# Patient Record
Sex: Female | Born: 1995 | Race: White | Hispanic: No | Marital: Married | Smoking: Never smoker
Health system: Southern US, Community
[De-identification: ages and names within clinical notes are randomized; demographics above are authoritative.]

## PROBLEM LIST (undated history)

## (undated) DIAGNOSIS — F329 Major depressive disorder, single episode, unspecified: Secondary | ICD-10-CM

## (undated) DIAGNOSIS — F32A Depression, unspecified: Secondary | ICD-10-CM

## (undated) DIAGNOSIS — O139 Gestational [pregnancy-induced] hypertension without significant proteinuria, unspecified trimester: Secondary | ICD-10-CM

## (undated) DIAGNOSIS — B019 Varicella without complication: Secondary | ICD-10-CM

## (undated) HISTORY — PX: NO PAST SURGERIES: SHX2092

## (undated) HISTORY — DX: Varicella without complication: B01.9

## (undated) HISTORY — DX: Major depressive disorder, single episode, unspecified: F32.9

## (undated) HISTORY — DX: Depression, unspecified: F32.A

## (undated) HISTORY — PX: WISDOM TOOTH EXTRACTION: SHX21

## (undated) HISTORY — DX: Gestational (pregnancy-induced) hypertension without significant proteinuria, unspecified trimester: O13.9

---
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
## 2016-01-13 ENCOUNTER — Ambulatory Visit: Payer: BC Managed Care – PPO | Admitting: Internal Medicine

## 2016-06-02 ENCOUNTER — Ambulatory Visit: Payer: BC Managed Care – PPO | Admitting: Family Medicine

## 2016-06-02 ENCOUNTER — Telehealth: Payer: Self-pay | Admitting: Surgical

## 2016-06-02 ENCOUNTER — Encounter: Payer: Self-pay | Admitting: Family Medicine

## 2016-06-02 ENCOUNTER — Telehealth: Payer: Self-pay

## 2016-06-02 ENCOUNTER — Ambulatory Visit (INDEPENDENT_AMBULATORY_CARE_PROVIDER_SITE_OTHER): Payer: BC Managed Care – PPO | Admitting: Family Medicine

## 2016-06-02 VITALS — BP 128/62 | HR 100 | Temp 98.7°F | Resp 18 | Wt 126.0 lb

## 2016-06-02 DIAGNOSIS — R1011 Right upper quadrant pain: Secondary | ICD-10-CM

## 2016-06-02 LAB — CBC WITH DIFFERENTIAL/PLATELET
BASOS ABS: 0 10*3/uL (ref 0.0–0.1)
Basophils Relative: 0.2 % (ref 0.0–3.0)
EOS ABS: 0.1 10*3/uL (ref 0.0–0.7)
Eosinophils Relative: 0.5 % (ref 0.0–5.0)
HCT: 40.4 % (ref 36.0–49.0)
Hemoglobin: 13.3 g/dL (ref 12.0–16.0)
LYMPHS ABS: 1.8 10*3/uL (ref 0.7–4.0)
LYMPHS PCT: 12.1 % — AB (ref 24.0–48.0)
MCHC: 33 g/dL (ref 31.0–37.0)
MCV: 86.9 fl (ref 78.0–98.0)
Monocytes Absolute: 0.8 10*3/uL (ref 0.1–1.0)
Monocytes Relative: 5.2 % (ref 3.0–12.0)
NEUTROS ABS: 12 10*3/uL — AB (ref 1.4–7.7)
NEUTROS PCT: 82 % — AB (ref 43.0–71.0)
PLATELETS: 258 10*3/uL (ref 150.0–575.0)
RBC: 4.65 Mil/uL (ref 3.80–5.70)
RDW: 12.7 % (ref 11.4–15.5)
WBC: 14.6 10*3/uL — ABNORMAL HIGH (ref 4.5–13.5)

## 2016-06-02 NOTE — Telephone Encounter (Signed)
Spoke with patient about Rt side abdominal pain. Pain started yesterday about 2 PM. She is not having fever, diarrhea, vomiting, or nausea. Pain scale of about a 3. She is having a little constipation.

## 2016-06-02 NOTE — Telephone Encounter (Signed)
Call pt:  I reviewed Joanna Walsh note. Her exam was not concerning for gallbladder issue or appendicitis, but her elevated white count is a little concerning. How is she feeling today?

## 2016-06-02 NOTE — Telephone Encounter (Signed)
pts mom(DPR signed) left v/m and [facility] and [facility]; pts mom said pt was complaining of rt side pain; pt has high pain tolerance.some of lab results were elevated and pts mom is concerned about infection and request R Baity NP to review labs and call pts mom; pts mom concerned about appendicitis or gall bladder problem.

## 2016-06-02 NOTE — Progress Notes (Signed)
Pre visit review using our clinic review tool, if applicable. No additional management support is needed unless otherwise documented below in the visit note. 

## 2016-06-02 NOTE — Patient Instructions (Signed)
Thank you for coming in today.  I do not think you have appendicitis, but am checking your blood count and will notify you of the results.  Try some acetaminophen (Tylenol) for pain and can apply heat. Please let me know if your pain worsens or if you develop a fever, diarrhea or constipation.

## 2016-06-02 NOTE — Progress Notes (Signed)
Subjective:    Patient ID: Joanna Walsh, female    DOB: 1996/04/01, 21 y.o.   MRN: [id number]  HPI This is a 21 yo female who presents today with right sided pain that started yesterday. She noticed it yesterday afternoon with stretching and sneezing. Pain is only brought on with movement. Pain is like a sore muscle, but a little worse. Slept well last night. No known trauma or new activities. No dysuria, no hematuria, no frequency, no nocturia. No nausea or vomiting. No fever- she checked. Having several bowel movements a day, small amount. No bloating or constipation. No pain with walking. Some pain up into right anterior ribs, but none into back, shoulder or across abdomen. Good fluid intake. Has not taken anything for symptoms. Mother is concerned because she rarely complains of pain. Would like CBC done.  Not sexually active, LMP 3 weeks ago. 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   Past Medical History:  Diagnosis Date  . Chicken pox   . Depression    No past surgical history on file. Family History  Problem Relation Age of Onset  . Prostate cancer Maternal Grandfather   . Heart disease Maternal Grandfather    Social History  Substance Use Topics  . Smoking status: Never Smoker  . Smokeless tobacco: Never Used  . Alcohol use No      Review of Systems Per HPI    Objective:   Physical Exam  Constitutional: She is oriented to person, place, and time. She appears well-developed and well-nourished. No distress.  HENT:  Head: Normocephalic and atraumatic.  Eyes: Conjunctivae are normal.  Neck: Normal range of motion. Neck supple.  Cardiovascular: Normal rate, regular rhythm and normal heart sounds.   Pulmonary/Chest: Effort normal and breath sounds normal.  Abdominal: Soft. Bowel sounds are normal. She exhibits no distension. There is tenderness in the right upper quadrant. There is no rigidity, no rebound, no guarding, no CVA tenderness, no  tenderness at McBurney's point and negative Murphy's sign.    Negative psoas.  Musculoskeletal: Normal range of motion.  Neurological: She is alert and oriented to person, place, and time.  Skin: Skin is warm and dry. She is not diaphoretic.  Psychiatric: She has a normal mood and affect. Her behavior is normal. Judgment and thought content normal.  Vitals reviewed.     BP 128/62 (BP Location: Left Arm, Patient Position: Sitting, Cuff Size: Normal)   Pulse 100   Temp 98.7 F (37.1 C) (Oral)   Resp 18   Wt 126 lb (57.2 kg)   LMP 05/10/2016   SpO2 97%   BMI 25.45 kg/m  Wt Readings from Last 3 Encounters:  06/02/16 126 lb (57.2 kg) (49 %, Z= -0.03)*  12/01/15 129 lb (58.5 kg) (57 %, Z= 0.17)*  08/14/15 123 lb 12.8 oz (56.2 kg) (48 %, Z= -0.04)*   * Growth percentiles are based on CDC 2-20 Years data.       Assessment & Plan:  1. Right upper quadrant abdominal pain - No worrisome findings on exam, no fever, nausea, or diarrhea, possible musculoskeletal etiology with tenderness over lower anterior ribs, pain only with movement. - CBC with Differential/Platelet- will notify her of findings and monitor symptoms - Patient Instructions  Thank you for coming in today.  I do not think you have appendicitis, but am checking your blood count and will notify you of the results.  Try some acetaminophen (Tylenol) for pain and can  apply heat. Please let me know if your pain worsens or if you develop a fever, diarrhea or constipation.   Olean Reeeborah Lucille Crichlow, FNP-BC  [facility], [facility]  06/02/2016 4:39 PM

## 2016-06-03 ENCOUNTER — Telehealth: Payer: Self-pay | Admitting: *Deleted

## 2016-06-03 NOTE — Telephone Encounter (Signed)
Spoke to pt, asked her how she was feeling today? Pt said she had pain and nausea around 7:30 this morning but now it is gone, she is feeling better. Told pt okay please call if any problems. Pt verbalized understanding.  Dr. Earlene PlaterWallace notified pt's status.

## 2016-06-03 NOTE — Telephone Encounter (Signed)
Pt's mother stated she is feeling better and already has spoken to Eunice BlaseDebbie will f/u if Sx return or worsen

## 2016-06-03 NOTE — Telephone Encounter (Signed)
noted 

## 2016-09-10 ENCOUNTER — Telehealth: Payer: Self-pay | Admitting: Internal Medicine

## 2016-09-10 NOTE — Telephone Encounter (Signed)
Left detailed msg on VM per HIPAA Copy placed in front office for pick up

## 2016-09-10 NOTE — Telephone Encounter (Signed)
Pt called and needs her immunizations for college, please call [phone] when ready for pickup.  Pt would really like to get 09/10/16, but made no promises.

## 2016-09-27 ENCOUNTER — Telehealth: Payer: Self-pay | Admitting: Internal Medicine

## 2016-09-27 NOTE — Telephone Encounter (Signed)
Patient brought in a Physical Examination form to be filled out by the provider.  The form was put in the providers Incoming prescription box.

## 2016-09-28 DIAGNOSIS — Z7689 Persons encountering health services in other specified circumstances: Secondary | ICD-10-CM

## 2016-09-29 ENCOUNTER — Ambulatory Visit (INDEPENDENT_AMBULATORY_CARE_PROVIDER_SITE_OTHER): Payer: BC Managed Care – PPO

## 2016-09-29 DIAGNOSIS — Z0289 Encounter for other administrative examinations: Secondary | ICD-10-CM | POA: Diagnosis not present

## 2016-09-29 LAB — POC URINALSYSI DIPSTICK (AUTOMATED)
BILIRUBIN UA: NEGATIVE
GLUCOSE UA: NEGATIVE
Ketones, UA: NEGATIVE
LEUKOCYTES UA: NEGATIVE
NITRITE UA: NEGATIVE
Protein, UA: NEGATIVE
RBC UA: NEGATIVE
Spec Grav, UA: >=1.03 — AB (ref 1.010–1.025)
Urobilinogen, UA: 0.2 E.U./dL
pH, UA: 6 (ref 5.0–8.0)

## 2016-10-05 NOTE — Telephone Encounter (Signed)
Form has been completed and given to pt.

## 2016-10-25 ENCOUNTER — Other Ambulatory Visit: Payer: Self-pay | Admitting: Internal Medicine

## 2016-10-25 DIAGNOSIS — F329 Major depressive disorder, single episode, unspecified: Secondary | ICD-10-CM

## 2016-10-25 DIAGNOSIS — F32A Depression, unspecified: Secondary | ICD-10-CM

## 2016-12-16 ENCOUNTER — Other Ambulatory Visit: Payer: Self-pay | Admitting: Internal Medicine

## 2016-12-16 DIAGNOSIS — F329 Major depressive disorder, single episode, unspecified: Secondary | ICD-10-CM

## 2016-12-16 DIAGNOSIS — F32A Depression, unspecified: Secondary | ICD-10-CM

## 2017-02-24 ENCOUNTER — Other Ambulatory Visit: Payer: Self-pay | Admitting: Internal Medicine

## 2017-02-24 DIAGNOSIS — F32A Depression, unspecified: Secondary | ICD-10-CM

## 2017-02-24 DIAGNOSIS — F329 Major depressive disorder, single episode, unspecified: Secondary | ICD-10-CM

## 2017-02-25 NOTE — Telephone Encounter (Signed)
Overdue CPE letter mailed to pt 

## 2017-04-02 ENCOUNTER — Other Ambulatory Visit: Payer: Self-pay | Admitting: Internal Medicine

## 2017-04-02 DIAGNOSIS — F32A Depression, unspecified: Secondary | ICD-10-CM

## 2017-04-02 DIAGNOSIS — F329 Major depressive disorder, single episode, unspecified: Secondary | ICD-10-CM

## 2017-04-27 ENCOUNTER — Telehealth: Payer: Self-pay | Admitting: Internal Medicine

## 2017-04-27 DIAGNOSIS — F32A Depression, unspecified: Secondary | ICD-10-CM

## 2017-04-27 DIAGNOSIS — F329 Major depressive disorder, single episode, unspecified: Secondary | ICD-10-CM

## 2017-04-27 NOTE — Telephone Encounter (Signed)
[facility] has been trying to fax request to office for a refill of Zoloft but states request are not going through.

## 2017-04-27 NOTE — Telephone Encounter (Signed)
Copied from CRM [phone]. Topic: Quick Communication - Rx Refill/Question >> Apr 27, 2017  4:10 PM Viviann SpareWhite, [place] wrote: Has the patient contacted their pharmacy? Yes.    sertraline (ZOLOFT) 50 MG tablet (PHARMACIST BEEN TRYING TO FAX REQUEST OVER, NOT GOING THRU)  (Agent: If no, request that the patient contact the pharmacy for the refill.)  Preferred Pharmacy (with phone number or street name):  [id number] [place] [place] - [address] AT [address]OSS [address] [place] [id number] Phone: [phone] Fax: [phone]   Agent: Please be advised that RX refills may take up to 3 business days. We ask that you follow-up with your pharmacy.

## 2017-04-28 MED ORDER — SERTRALINE HCL 50 MG PO TABS: 50.0000 mg | ORAL_TABLET | Freq: Every day | ORAL | 0 refills | Status: DC

## 2017-04-28 NOTE — Telephone Encounter (Signed)
I have sent in 1 refill, but pt must schedule CPE for any more refills... Next refill will be denied if CPE has not been scheduled

## 2017-05-01 ENCOUNTER — Other Ambulatory Visit: Payer: Self-pay | Admitting: Internal Medicine

## 2017-05-01 DIAGNOSIS — F32A Depression, unspecified: Secondary | ICD-10-CM

## 2017-05-01 DIAGNOSIS — F329 Major depressive disorder, single episode, unspecified: Secondary | ICD-10-CM

## 2017-05-02 NOTE — Telephone Encounter (Signed)
Letter mailed to pt as well.

## 2018-04-24 ENCOUNTER — Encounter: Payer: Self-pay | Admitting: Internal Medicine

## 2018-04-24 ENCOUNTER — Ambulatory Visit (INDEPENDENT_AMBULATORY_CARE_PROVIDER_SITE_OTHER): Payer: Managed Care, Other (non HMO) | Admitting: Internal Medicine

## 2018-04-24 VITALS — BP 110/66 | HR 98 | Temp 99.0°F | Wt 128.0 lb

## 2018-04-24 DIAGNOSIS — R509 Fever, unspecified: Secondary | ICD-10-CM | POA: Diagnosis not present

## 2018-04-24 DIAGNOSIS — R05 Cough: Secondary | ICD-10-CM | POA: Diagnosis not present

## 2018-04-24 DIAGNOSIS — J029 Acute pharyngitis, unspecified: Secondary | ICD-10-CM

## 2018-04-24 DIAGNOSIS — R059 Cough, unspecified: Secondary | ICD-10-CM

## 2018-04-24 LAB — POC INFLUENZA A&B (BINAX/QUICKVUE)
Influenza A, POC: NEGATIVE
Influenza B, POC: NEGATIVE

## 2018-04-24 LAB — POCT RAPID STREP A (OFFICE): Rapid Strep A Screen: NEGATIVE

## 2018-04-24 NOTE — Addendum Note (Signed)
Addended by: Roena MaladyEVONTENNO, Akeia Perot Y on: 04/24/2018 04:24 PM   Modules accepted: Orders

## 2018-04-24 NOTE — Patient Instructions (Signed)
Sore Throat  When you have a sore throat, your throat may feel:  · Tender.  · Burning.  · Irritated.  · Scratchy.  · Painful when you swallow.  · Painful when you talk.  Many things can cause a sore throat, such as:  · An infection.  · Allergies.  · Dry air.  · Smoke or pollution.  · Radiation treatment.  · Gastroesophageal reflux disease (GERD).  · A tumor.  A sore throat can be the first sign of another sickness. It can happen with other problems, like:  · Coughing.  · Sneezing.  · Fever.  · Swelling in the neck.  Most sore throats go away without treatment.  Follow these instructions at home:         · Take over-the-counter medicines only as told by your doctor.  ? If your child has a sore throat, do not give your child aspirin.  · Drink enough fluids to keep your pee (urine) pale yellow.  · Rest when you feel you need to.  · To help with pain:  ? Sip warm liquids, such as broth, herbal tea, or warm water.  ? Eat or drink cold or frozen liquids, such as frozen ice pops.  ? Gargle with a salt-water mixture 3-4 times a day or as needed. To make a salt-water mixture, add ½-1 tsp (3-6 g) of salt to 1 cup (237 mL) of warm water. Mix it until you cannot see the salt anymore.  ? Suck on hard candy or throat lozenges.  ? Put a cool-mist humidifier in your bedroom at night.  ? Sit in the bathroom with the door closed for 5-10 minutes while you run hot water in the shower.  · Do not use any products that contain nicotine or tobacco, such as cigarettes, e-cigarettes, and chewing tobacco. If you need help quitting, ask your doctor.  · Wash your hands well and often with soap and water. If soap and water are not available, use hand sanitizer.  Contact a doctor if:  · You have a fever for more than 2-3 days.  · You keep having symptoms for more than 2-3 days.  · Your throat does not get better in 7 days.  · You have a fever and your symptoms suddenly get worse.  · Your child who is 3 months to 3 years old has a temperature of  102.2°F (39°C) or higher.  Get help right away if:  · You have trouble breathing.  · You cannot swallow fluids, soft foods, or your saliva.  · You have swelling in your throat or neck that gets worse.  · You keep feeling sick to your stomach (nauseous).  · You keep throwing up (vomiting).  Summary  · A sore throat is pain, burning, irritation, or scratchiness in the throat. Many things can cause a sore throat.  · Take over-the-counter medicines only as told by your doctor. Do not give your child aspirin.  · Drink plenty of fluids, and rest as needed.  · Contact a doctor if your symptoms get worse or your sore throat does not get better within 7 days.  This information is not intended to replace advice given to you by your health care provider. Make sure you discuss any questions you have with your health care provider.  Document Released: 01/20/2008 Document Revised: 09/12/2017 Document Reviewed: 09/12/2017  Elsevier Interactive Patient Education © 2019 [employer]

## 2018-04-24 NOTE — Progress Notes (Signed)
Subjective:    Patient ID: Joanna FisherKaitlyn Furber, female    DOB: [birth year], 22 y.o.   MRN: [id number]  HPI  Pt presents to the clinic today with c/o sore throat and cough. She reports this started yesterday. She is having some difficulty swallowing. The cough is non productive. She denies runny nose, nasal congestion, ear pain, or shortness of breath. She reports fever up to 102.0. She has tired Dayquil with some relief. She has had sick contacts diagnosed with strep.   Review of Systems      Past Medical History:  Diagnosis Date  . Chicken pox   . Depression     Current Outpatient Medications  Medication Sig Dispense Refill  . sertraline (ZOLOFT) 50 MG tablet Take 1 tablet (50 mg total) by mouth daily. LAST REFILL SCHEDULE ANNUAL EXAM (Patient not taking: Reported on 04/24/2018) 30 tablet 0   No current facility-administered medications for this visit.     No Known Allergies  Family History  Problem Relation Age of Onset  . Prostate cancer Maternal Grandfather   . Heart disease Maternal Grandfather     Social History   Socioeconomic History  . Marital status: Single    Spouse name: Not on file  . Number of children: Not on file  . Years of education: Not on file  . Highest education level: Not on file  Occupational History  . Not on file  Social Needs  . Financial resource strain: Not on file  . Food insecurity:    Worry: Not on file    Inability: Not on file  . Transportation needs:    Medical: Not on file    Non-medical: Not on file  Tobacco Use  . Smoking status: Never Smoker  . Smokeless tobacco: Never Used  Substance and Sexual Activity  . Alcohol use: No    Alcohol/week: 0.0 standard drinks  . Drug use: No  . Sexual activity: Never  Lifestyle  . Physical activity:    Days per week: Not on file    Minutes per session: Not on file  . Stress: Not on file  Relationships  . Social connections:    Talks on phone: Not on file    Gets together: Not on  file    Attends religious service: Not on file    Active member of club or organization: Not on file    Attends meetings of clubs or organizations: Not on file    Relationship status: Not on file  . Intimate partner violence:    Fear of current or ex partner: Not on file    Emotionally abused: Not on file    Physically abused: Not on file    Forced sexual activity: Not on file  Other Topics Concern  . Not on file  Social History Narrative  . Not on file     Constitutional: Pt reports fever. Denies malaise, fatigue, headache or abrupt weight changes.  HEENT: Pt reports sore throat. Denies eye pain, eye redness, ear pain, ringing in the ears, wax buildup, runny nose, nasal congestion, bloody nose. Respiratory: Pt reports cough. Denies difficulty breathing, shortness of breath, or sputum production.   Cardiovascular: Denies chest pain, chest tightness, palpitations or swelling in the hands or feet.   No other specific complaints in a complete review of systems (except as listed in HPI above).  Objective:   Physical Exam  BP 110/66   Pulse 98   Temp 99 F (37.2 C) (Oral)   Wt  128 lb (58.1 kg)   LMP 04/10/2018   SpO2 98%   BMI 25.85 kg/m  Wt Readings from Last 3 Encounters:  04/24/18 128 lb (58.1 kg)  06/02/16 126 lb (57.2 kg) (49 %, Z= -0.03)*  12/01/15 129 lb (58.5 kg) (57 %, Z= 0.17)*   * Growth percentiles are based on CDC (Girls, 2-20 Years) data.    General: Appears her stated age, well developed, well nourished in NAD. HEENT: Head: normal shape and size, no sinus tenderness noted;  Ears: Tm's gray and intact, normal light reflex; Nose: mucosa pink and moist, septum midline; Throat/Mouth: Teeth present, mucosa erythematous and moist, no exudate, lesions or ulcerations noted.  Neck: Bilateral anterior cervical adenopathy noted. Cardiovascular: Normal rate and rhythm. S1,S2 noted.  No murmur, rubs or gallops noted.  Pulmonary/Chest: Normal effort and positive vesicular  breath sounds. No respiratory distress. No wheezes, rales or ronchi noted.   BMET    Component Value Date/Time   NA 139 12/01/2015 1341   K 4.0 12/01/2015 1341   CL 105 12/01/2015 1341   CO2 27 12/01/2015 1341   GLUCOSE 72 12/01/2015 1341   BUN 14 12/01/2015 1341   CREATININE 0.91 12/01/2015 1341   CALCIUM 9.5 12/01/2015 1341    Lipid Panel     Component Value Date/Time   CHOL 110 12/01/2015 1341   TRIG 68.0 12/01/2015 1341   HDL 45.00 12/01/2015 1341   CHOLHDL 2 12/01/2015 1341   VLDL 13.6 12/01/2015 1341   LDLCALC 51 12/01/2015 1341    CBC    Component Value Date/Time   WBC 14.6 (H) 06/02/2016 1157   RBC 4.65 06/02/2016 1157   HGB 13.3 06/02/2016 1157   HCT 40.4 06/02/2016 1157   PLT 258.0 06/02/2016 1157   MCV 86.9 06/02/2016 1157   MCHC 33.0 06/02/2016 1157   RDW 12.7 06/02/2016 1157   LYMPHSABS 1.8 06/02/2016 1157   MONOABS 0.8 06/02/2016 1157   EOSABS 0.1 06/02/2016 1157   BASOSABS 0.0 06/02/2016 1157    Hgb A1C No results found for: HGBA1C          Assessment & Plan:   Sore Throat, Cough, Fever:  RST: negative Rapid flu: negative Salt water gargles may be helpful Can take Ibuprofen 600 mg every 8 hours as needed for pain and inflammation Can take Zyrtec 10 mg day to reduce any PND that could be a contributing factor  Return precautions discussed Nicki Reaperegina Tieasha Larsen, NP

## 2018-05-01 ENCOUNTER — Ambulatory Visit: Payer: Managed Care, Other (non HMO) | Admitting: Family Medicine

## 2018-12-11 ENCOUNTER — Telehealth: Payer: Self-pay | Admitting: Internal Medicine

## 2018-12-11 NOTE — Telephone Encounter (Signed)
Last OV acute 03/2018 but last CPE 2017... does pt need to schedule CPE?

## 2018-12-11 NOTE — Telephone Encounter (Signed)
Ok to give Tdap but pt needs to schedule annual exam

## 2018-12-11 NOTE — Telephone Encounter (Signed)
Best number [phone] Pt called needing to get a td booster before she can go to school.  She needs this by Wednesday  Is it ok to schedule

## 2018-12-12 NOTE — Telephone Encounter (Signed)
After 11am and before 2pm tomorrow

## 2018-12-12 NOTE — Telephone Encounter (Signed)
Can I put pt on schedule and  Comment see melanie. Either this afternoon or tomorrow I have not called pt yet

## 2018-12-12 NOTE — Telephone Encounter (Signed)
Left message asking pt to call office  °

## 2018-12-13 NOTE — Telephone Encounter (Signed)
Spoke with pt she stated [facility] went to [facility]

## 2019-10-23 ENCOUNTER — Encounter: Payer: Managed Care, Other (non HMO) | Admitting: Internal Medicine

## 2019-10-25 ENCOUNTER — Other Ambulatory Visit: Payer: Self-pay

## 2019-10-25 ENCOUNTER — Encounter: Payer: Self-pay | Admitting: Internal Medicine

## 2019-10-25 ENCOUNTER — Ambulatory Visit (INDEPENDENT_AMBULATORY_CARE_PROVIDER_SITE_OTHER): Payer: Managed Care, Other (non HMO) | Admitting: Internal Medicine

## 2019-10-25 VITALS — BP 120/80 | HR 71 | Temp 98.1°F | Ht 59.0 in | Wt 167.0 lb

## 2019-10-25 DIAGNOSIS — F330 Major depressive disorder, recurrent, mild: Secondary | ICD-10-CM

## 2019-10-25 DIAGNOSIS — Z0000 Encounter for general adult medical examination without abnormal findings: Secondary | ICD-10-CM | POA: Diagnosis not present

## 2019-10-25 LAB — COMPREHENSIVE METABOLIC PANEL
ALT: 15 U/L (ref 0–35)
AST: 19 U/L (ref 0–37)
Albumin: 4.6 g/dL (ref 3.5–5.2)
Alkaline Phosphatase: 51 U/L (ref 39–117)
BUN: 15 mg/dL (ref 6–23)
CO2: 28 mEq/L (ref 19–32)
Calcium: 9.7 mg/dL (ref 8.4–10.5)
Chloride: 104 mEq/L (ref 96–112)
Creatinine, Ser: 0.86 mg/dL (ref 0.40–1.20)
GFR: 82.14 mL/min (ref >60.00)
Glucose, Bld: 91 mg/dL (ref 70–99)
Potassium: 4.2 mEq/L (ref 3.5–5.1)
Sodium: 138 mEq/L (ref 135–145)
Total Bilirubin: 0.8 mg/dL (ref 0.2–1.2)
Total Protein: 7.2 g/dL (ref 6.0–8.3)

## 2019-10-25 LAB — CBC
HCT: 38.9 % (ref 36.0–46.0)
Hemoglobin: 13.2 g/dL (ref 12.0–15.0)
MCHC: 34 g/dL (ref 30.0–36.0)
MCV: 87.6 fl (ref 78.0–100.0)
Platelets: 253 10*3/uL (ref 150.0–400.0)
RBC: 4.43 Mil/uL (ref 3.87–5.11)
RDW: 12.1 % (ref 11.5–15.5)
WBC: 8.6 10*3/uL (ref 4.0–10.5)

## 2019-10-25 LAB — LIPID PANEL
Cholesterol: 140 mg/dL (ref 0–200)
HDL: 53.2 mg/dL (ref >39.00)
LDL Cholesterol: 74 mg/dL (ref 0–99)
NonHDL: 87.23
Total CHOL/HDL Ratio: 3
Triglycerides: 66 mg/dL (ref 0.0–149.0)
VLDL: 13.2 mg/dL (ref 0.0–40.0)

## 2019-10-25 LAB — TSH: TSH: 1.65 u[IU]/mL (ref 0.35–4.50)

## 2019-10-25 LAB — HEMOGLOBIN A1C: Hgb A1c MFr Bld: 5.3 % (ref 4.6–6.5)

## 2019-10-25 MED ORDER — BUPROPION HCL ER (XL) 150 MG PO TB24: 150.0000 mg | ORAL_TABLET | Freq: Every day | ORAL | 2 refills | Status: DC

## 2019-10-25 NOTE — Progress Notes (Signed)
Subjective:    Patient ID: Joanna Walsh, female    DOB: 08-23-1995, 24 y.o.   MRN: [id number]  HPI  Pt presents to the clinic today for her annual exam.  Depression: Persistent. She is no longer taking Sertraline die to SI. She does not see a therapist. She denies anxiety, SI/HI.  Flu: never Tetanus: 11/2018 Covid: 06/2019, 07/2019 Pap Smear: never, [facility] Dentist: biannually  Diet: She does eat meat. She consumes more veggies than fruit. She does eat some fried foods. She drinks mostly water, green tea. Exercise: None since Covid  Review of Systems      Past Medical History:  Diagnosis Date  . Chicken pox   . Depression     Current Outpatient Medications  Medication Sig Dispense Refill  . sertraline (ZOLOFT) 50 MG tablet Take 1 tablet (50 mg total) by mouth daily. LAST REFILL SCHEDULE ANNUAL EXAM (Patient not taking: Reported on 04/24/2018) 30 tablet 0   No current facility-administered medications for this visit.    No Known Allergies  Family History  Problem Relation Age of Onset  . Prostate cancer Maternal Grandfather   . Heart disease Maternal Grandfather     Social History   Socioeconomic History  . Marital status: Single    Spouse name: Not on file  . Number of children: Not on file  . Years of education: Not on file  . Highest education level: Not on file  Occupational History  . Not on file  Tobacco Use  . Smoking status: Never Smoker  . Smokeless tobacco: Never Used  Substance and Sexual Activity  . Alcohol use: No    Alcohol/week: 0.0 standard drinks  . Drug use: No  . Sexual activity: Never  Other Topics Concern  . Not on file  Social History Narrative  . Not on file   Social Determinants of Health   Financial Resource Strain:   . Difficulty of Paying Living Expenses:   Food Insecurity:   . Worried About Programme researcher, broadcasting/film/video in the Last Year:   . Barista in the Last Year:   Transportation Needs:   . Automotive engineer (Medical):   Marland Kitchen Lack of Transportation (Non-Medical):   Physical Activity:   . Days of Exercise per Week:   . Minutes of Exercise per Session:   Stress:   . Feeling of Stress :   Social Connections:   . Frequency of Communication with Friends and Family:   . Frequency of Social Gatherings with Friends and Family:   . Attends Religious Services:   . Active Member of Clubs or Organizations:   . Attends Banker Meetings:   Marland Kitchen Marital Status:   Intimate Partner Violence:   . Fear of Current or Ex-Partner:   . Emotionally Abused:   Marland Kitchen Physically Abused:   . Sexually Abused:      Constitutional: Denies fever, malaise, fatigue, headache or abrupt weight changes.  HEENT: Denies eye pain, eye redness, ear pain, ringing in the ears, wax buildup, runny nose, nasal congestion, bloody nose, or sore throat. Respiratory: Denies difficulty breathing, shortness of breath, cough or sputum production.   Cardiovascular: Denies chest pain, chest tightness, palpitations or swelling in the hands or feet.  Gastrointestinal: Denies abdominal pain, bloating, constipation, diarrhea or blood in the stool.  GU: Denies urgency, frequency, pain with urination, burning sensation, blood in urine, odor or discharge. Musculoskeletal: Denies decrease in range of motion, difficulty with gait, muscle  pain or joint pain and swelling.  Skin: Denies redness, rashes, lesions or ulcercations.  Neurological: Denies dizziness, difficulty with memory, difficulty with speech or problems with balance and coordination.  Psych: Pt reports depression. Denies anxiety, SI/HI.  No other specific complaints in a complete review of systems (except as listed in HPI above).  Objective:   Physical Exam  BP 120/80   Pulse 71   Temp 98.1 F (36.7 C) (Temporal)   Ht 4\' 11"  (1.499 m)   Wt 167 lb (75.8 kg)   LMP 10/18/2019 Comment: irregular  SpO2 98%   BMI 33.73 kg/m   Wt Readings from Last 3 Encounters:    04/24/18 128 lb (58.1 kg)  06/02/16 126 lb (57.2 kg) (49 %, Z= -0.03)*  12/01/15 129 lb (58.5 kg) (57 %, Z= 0.17)*   * Growth percentiles are based on CDC (Girls, 2-20 Years) data.    General: Appears her stated age, obese, in NAD. Skin: Warm, dry and intact. No rashesnoted. HEENT: Head: normal shape and size; Eyes: sclera white, no icterus, conjunctiva pink, PERRLA and EOMs intact; Neck:  Neck supple, trachea midline. No masses, lumps or thyromegaly present.  Cardiovascular: Normal rate and rhythm. S1,S2 noted.  No murmur, rubs or gallops noted. Noor BLE edema.  Pulmonary/Chest: Normal effort and positive vesicular breath sounds. No respiratory distress. No wheezes, rales or ronchi noted.  Abdomen: Soft and nontender. Normal bowel sounds. No distention or masses noted. Liver, spleen and kidneys non palpable. Musculoskeletal: Normal range of motion. Strength 5/5 BUE/BLE. No difficulty with gait.  Neurological: Alert and oriented. Cranial nerves II-XII grossly intact. Coordination normal.  Psychiatric: Mood and affect normal. Behavior is normal. Judgment and thought content normal.    BMET    Component Value Date/Time   NA 139 12/01/2015 1341   K 4.0 12/01/2015 1341   CL 105 12/01/2015 1341   CO2 27 12/01/2015 1341   GLUCOSE 72 12/01/2015 1341   BUN 14 12/01/2015 1341   CREATININE 0.91 12/01/2015 1341   CALCIUM 9.5 12/01/2015 1341    Lipid Panel     Component Value Date/Time   CHOL 110 12/01/2015 1341   TRIG 68.0 12/01/2015 1341   HDL 45.00 12/01/2015 1341   CHOLHDL 2 12/01/2015 1341   VLDL 13.6 12/01/2015 1341   LDLCALC 51 12/01/2015 1341    CBC    Component Value Date/Time   WBC 14.6 (H) 06/02/2016 1157   RBC 4.65 06/02/2016 1157   HGB 13.3 06/02/2016 1157   HCT 40.4 06/02/2016 1157   PLT 258.0 06/02/2016 1157   MCV 86.9 06/02/2016 1157   MCHC 33.0 06/02/2016 1157   RDW 12.7 06/02/2016 1157   LYMPHSABS 1.8 06/02/2016 1157   MONOABS 0.8 06/02/2016 1157    EOSABS 0.1 06/02/2016 1157   BASOSABS 0.0 06/02/2016 1157    Hgb A1C No results found for: HGBA1C         Assessment & Plan:   Preventative Health Maintenance:  Encouraged her to get a flu shot in the fall Tetanus UTD She will schedule her pap smear with [facility] Encouraged her to consume a balanced diet and exercise regimen Advised her to see an eye doctor and dentist annually Will check CBC, CMET, TSH, Lipid profile and A1C today  RTC in 1 year, sooner if needed 07/31/2016, NP This visit occurred during the SARS-CoV-2 public health emergency.  Safety protocols were in place, including screening questions prior to the visit, additional usage of staff PPE,  and extensive cleaning of exam room while observing appropriate contact time as indicated for disinfecting solutions.

## 2019-10-25 NOTE — Assessment & Plan Note (Signed)
Deteriorated Support offered today Will trial Wellbutrin She will consider therapy and call to see if her insurance cover

## 2019-10-25 NOTE — Patient Instructions (Signed)
Health Maintenance, Female Adopting a healthy lifestyle and getting preventive care are important in promoting health and wellness. Ask your health care provider about:  The right schedule for you to have regular tests and exams.  Things you can do on your own to prevent diseases and keep yourself healthy. What should I know about diet, weight, and exercise? Eat a healthy diet   Eat a diet that includes plenty of vegetables, fruits, low-fat dairy products, and lean protein.  Do not eat a lot of foods that are high in solid fats, added sugars, or sodium. Maintain a healthy weight Body mass index (BMI) is used to identify weight problems. It estimates body fat based on height and weight. Your health care provider can help determine your BMI and help you achieve or maintain a healthy weight. Get regular exercise Get regular exercise. This is one of the most important things you can do for your health. Most adults should:  Exercise for at least 150 minutes each week. The exercise should increase your heart rate and make you sweat (moderate-intensity exercise).  Do strengthening exercises at least twice a week. This is in addition to the moderate-intensity exercise.  Spend less time sitting. Even light physical activity can be beneficial. Watch cholesterol and blood lipids Have your blood tested for lipids and cholesterol at 23 years of age, then have this test every 5 years. Have your cholesterol levels checked more often if:  Your lipid or cholesterol levels are high.  You are older than 23 years of age.  You are at high risk for heart disease. What should I know about cancer screening? Depending on your health history and family history, you may need to have cancer screening at various ages. This may include screening for:  Breast cancer.  Cervical cancer.  Colorectal cancer.  Skin cancer.  Lung cancer. What should I know about heart disease, diabetes, and high blood  pressure? Blood pressure and heart disease  High blood pressure causes heart disease and increases the risk of stroke. This is more likely to develop in people who have high blood pressure readings, are of African descent, or are overweight.  Have your blood pressure checked: ? Every 3-5 years if you are 18-39 years of age. ? Every year if you are 40 years old or older. Diabetes Have regular diabetes screenings. This checks your fasting blood sugar level. Have the screening done:  Once every three years after age 40 if you are at a normal weight and have a low risk for diabetes.  More often and at a younger age if you are overweight or have a high risk for diabetes. What should I know about preventing infection? Hepatitis B If you have a higher risk for hepatitis B, you should be screened for this virus. Talk with your health care provider to find out if you are at risk for hepatitis B infection. Hepatitis C Testing is recommended for:  Everyone born from 1945 through 1965.  Anyone with known risk factors for hepatitis C. Sexually transmitted infections (STIs)  Get screened for STIs, including gonorrhea and chlamydia, if: ? You are sexually active and are younger than 24 years of age. ? You are older than 24 years of age and your health care provider tells you that you are at risk for this type of infection. ? Your sexual activity has changed since you were last screened, and you are at increased risk for chlamydia or gonorrhea. Ask your health care provider if   you are at risk.  Ask your health care provider about whether you are at high risk for HIV. Your health care provider may recommend a prescription medicine to help prevent HIV infection. If you choose to take medicine to prevent HIV, you should first get tested for HIV. You should then be tested every 3 months for as long as you are taking the medicine. Pregnancy  If you are about to stop having your period (premenopausal) and  you may become pregnant, seek counseling before you get pregnant.  Take 400 to 800 micrograms (mcg) of folic acid every day if you become pregnant.  Ask for birth control (contraception) if you want to prevent pregnancy. Osteoporosis and menopause Osteoporosis is a disease in which the bones lose minerals and strength with aging. This can result in bone fractures. If you are 65 years old or older, or if you are at risk for osteoporosis and fractures, ask your health care provider if you should:  Be screened for bone loss.  Take a calcium or vitamin D supplement to lower your risk of fractures.  Be given hormone replacement therapy (HRT) to treat symptoms of menopause. Follow these instructions at home: Lifestyle  Do not use any products that contain nicotine or tobacco, such as cigarettes, e-cigarettes, and chewing tobacco. If you need help quitting, ask your health care provider.  Do not use street drugs.  Do not share needles.  Ask your health care provider for help if you need support or information about quitting drugs. Alcohol use  Do not drink alcohol if: ? Your health care provider tells you not to drink. ? You are pregnant, may be pregnant, or are planning to become pregnant.  If you drink alcohol: ? Limit how much you use to 0-1 drink a day. ? Limit intake if you are breastfeeding.  Be aware of how much alcohol is in your drink. In the [place] one drink equals one 12 oz bottle of beer (355 mL), one 5 oz glass of wine (148 mL), or one 1 oz glass of hard liquor (44 mL). General instructions  Schedule regular health, dental, and eye exams.  Stay current with your vaccines.  Tell your health care provider if: ? You often feel depressed. ? You have ever been abused or do not feel safe at home. Summary  Adopting a healthy lifestyle and getting preventive care are important in promoting health and wellness.  Follow your health care provider's instructions about healthy  diet, exercising, and getting tested or screened for diseases.  Follow your health care provider's instructions on monitoring your cholesterol and blood pressure. This information is not intended to replace advice given to you by your health care provider. Make sure you discuss any questions you have with your health care provider. Document Revised: 04/05/2018 Document Reviewed: 04/05/2018 Elsevier Patient Education  2020 [employer]  

## 2019-11-02 ENCOUNTER — Encounter: Payer: Self-pay | Admitting: Internal Medicine

## 2019-11-16 ENCOUNTER — Other Ambulatory Visit: Payer: Self-pay | Admitting: Internal Medicine

## 2019-11-16 DIAGNOSIS — Z0000 Encounter for general adult medical examination without abnormal findings: Secondary | ICD-10-CM

## 2020-02-12 ENCOUNTER — Other Ambulatory Visit: Payer: Self-pay | Admitting: Internal Medicine

## 2020-02-12 DIAGNOSIS — Z0000 Encounter for general adult medical examination without abnormal findings: Secondary | ICD-10-CM

## 2020-02-14 LAB — HM PAP SMEAR: HM Pap smear: NORMAL

## 2020-02-26 LAB — OB RESULTS CONSOLE ANTIBODY SCREEN: Antibody Screen: NEGATIVE

## 2020-02-26 LAB — OB RESULTS CONSOLE RPR: RPR: NONREACTIVE

## 2020-02-26 LAB — OB RESULTS CONSOLE ABO/RH: RH Type: POSITIVE

## 2020-02-26 LAB — OB RESULTS CONSOLE GC/CHLAMYDIA
Chlamydia: NEGATIVE
Gonorrhea: NEGATIVE

## 2020-02-26 LAB — OB RESULTS CONSOLE HIV ANTIBODY (ROUTINE TESTING): HIV: NONREACTIVE

## 2020-02-26 LAB — OB RESULTS CONSOLE RUBELLA ANTIBODY, IGM: Rubella: IMMUNE

## 2020-02-26 LAB — OB RESULTS CONSOLE HEPATITIS B SURFACE ANTIGEN: Hepatitis B Surface Ag: NEGATIVE

## 2020-05-27 DIAGNOSIS — Z419 Encounter for procedure for purposes other than remedying health state, unspecified: Secondary | ICD-10-CM | POA: Diagnosis not present

## 2020-06-24 DIAGNOSIS — Z419 Encounter for procedure for purposes other than remedying health state, unspecified: Secondary | ICD-10-CM | POA: Diagnosis not present

## 2020-07-25 DIAGNOSIS — Z419 Encounter for procedure for purposes other than remedying health state, unspecified: Secondary | ICD-10-CM | POA: Diagnosis not present

## 2020-08-24 DIAGNOSIS — Z419 Encounter for procedure for purposes other than remedying health state, unspecified: Secondary | ICD-10-CM | POA: Diagnosis not present

## 2020-09-02 ENCOUNTER — Telehealth (HOSPITAL_COMMUNITY): Payer: Self-pay | Admitting: *Deleted

## 2020-09-02 ENCOUNTER — Encounter (HOSPITAL_COMMUNITY): Payer: Self-pay | Admitting: *Deleted

## 2020-09-02 NOTE — Telephone Encounter (Signed)
Preadmission screen  

## 2020-09-03 ENCOUNTER — Encounter (HOSPITAL_COMMUNITY): Payer: Self-pay | Admitting: *Deleted

## 2020-09-09 ENCOUNTER — Telehealth (HOSPITAL_COMMUNITY): Payer: Self-pay | Admitting: *Deleted

## 2020-09-09 NOTE — Telephone Encounter (Signed)
Preadmission screen  

## 2020-09-12 LAB — OB RESULTS CONSOLE GBS: GBS: NEGATIVE

## 2020-09-15 ENCOUNTER — Other Ambulatory Visit (HOSPITAL_COMMUNITY): Payer: Managed Care, Other (non HMO)

## 2020-09-17 ENCOUNTER — Inpatient Hospital Stay (HOSPITAL_COMMUNITY)
Admission: AD | Admit: 2020-09-17 | Payer: Managed Care, Other (non HMO) | Source: Home / Self Care | Admitting: Obstetrics and Gynecology

## 2020-09-17 ENCOUNTER — Inpatient Hospital Stay (HOSPITAL_COMMUNITY): Payer: Managed Care, Other (non HMO)

## 2020-09-23 ENCOUNTER — Other Ambulatory Visit (HOSPITAL_COMMUNITY)
Admission: RE | Admit: 2020-09-23 | Discharge: 2020-09-23 | Disposition: A | Discharge status: Home / Self Care | Payer: Managed Care, Other (non HMO) | Source: Ambulatory Visit | Attending: Obstetrics and Gynecology | Admitting: Obstetrics and Gynecology

## 2020-09-23 DIAGNOSIS — Z20822 Contact with and (suspected) exposure to covid-19: Secondary | ICD-10-CM | POA: Insufficient documentation

## 2020-09-23 DIAGNOSIS — Z01812 Encounter for preprocedural laboratory examination: Secondary | ICD-10-CM | POA: Insufficient documentation

## 2020-09-23 LAB — SARS CORONAVIRUS 2 (TAT 6-24 HRS): SARS Coronavirus 2: NEGATIVE

## 2020-09-24 DIAGNOSIS — Z419 Encounter for procedure for purposes other than remedying health state, unspecified: Secondary | ICD-10-CM | POA: Diagnosis not present

## 2020-09-25 ENCOUNTER — Encounter (HOSPITAL_COMMUNITY): Payer: Self-pay | Admitting: Obstetrics and Gynecology

## 2020-09-25 ENCOUNTER — Inpatient Hospital Stay (HOSPITAL_COMMUNITY): Payer: Managed Care, Other (non HMO) | Admitting: Anesthesiology

## 2020-09-25 ENCOUNTER — Inpatient Hospital Stay (HOSPITAL_COMMUNITY): Payer: Managed Care, Other (non HMO)

## 2020-09-25 ENCOUNTER — Encounter (HOSPITAL_COMMUNITY)
Admission: AD | Disposition: A | Discharge status: Home / Self Care | Payer: Self-pay | Source: Home / Self Care | Attending: Obstetrics and Gynecology

## 2020-09-25 ENCOUNTER — Other Ambulatory Visit: Payer: Self-pay

## 2020-09-25 ENCOUNTER — Inpatient Hospital Stay (HOSPITAL_COMMUNITY)
Admission: AD | Admit: 2020-09-25 | Discharge: 2020-09-27 | DRG: 788 | Disposition: A | Discharge status: Home / Self Care | Payer: Managed Care, Other (non HMO) | Attending: Obstetrics and Gynecology | Admitting: Obstetrics and Gynecology

## 2020-09-25 DIAGNOSIS — Z3A38 38 weeks gestation of pregnancy: Secondary | ICD-10-CM | POA: Diagnosis not present

## 2020-09-25 DIAGNOSIS — O134 Gestational [pregnancy-induced] hypertension without significant proteinuria, complicating childbirth: Secondary | ICD-10-CM | POA: Diagnosis present

## 2020-09-25 DIAGNOSIS — Z20822 Contact with and (suspected) exposure to covid-19: Secondary | ICD-10-CM | POA: Diagnosis present

## 2020-09-25 DIAGNOSIS — O99214 Obesity complicating childbirth: Secondary | ICD-10-CM | POA: Diagnosis present

## 2020-09-25 DIAGNOSIS — Z98891 History of uterine scar from previous surgery: Secondary | ICD-10-CM

## 2020-09-25 DIAGNOSIS — Z3490 Encounter for supervision of normal pregnancy, unspecified, unspecified trimester: Secondary | ICD-10-CM | POA: Diagnosis present

## 2020-09-25 LAB — CBC
HCT: 33.5 % — ABNORMAL LOW (ref 36.0–46.0)
HCT: 34.5 % — ABNORMAL LOW (ref 36.0–46.0)
Hemoglobin: 11.2 g/dL — ABNORMAL LOW (ref 12.0–15.0)
Hemoglobin: 11.4 g/dL — ABNORMAL LOW (ref 12.0–15.0)
MCH: 29.9 pg (ref 26.0–34.0)
MCH: 29.8 pg (ref 26.0–34.0)
MCHC: 33.4 g/dL (ref 30.0–36.0)
MCHC: 33 g/dL (ref 30.0–36.0)
MCV: 89.6 fL (ref 80.0–100.0)
MCV: 90.1 fL (ref 80.0–100.0)
Platelets: 216 10*3/uL (ref 150–400)
Platelets: 189 10*3/uL (ref 150–400)
RBC: 3.74 MIL/uL — ABNORMAL LOW (ref 3.87–5.11)
RBC: 3.83 MIL/uL — ABNORMAL LOW (ref 3.87–5.11)
RDW: 13.6 % (ref 11.5–15.5)
RDW: 13.6 % (ref 11.5–15.5)
WBC: 14.4 10*3/uL — ABNORMAL HIGH (ref 4.0–10.5)
WBC: 15.5 10*3/uL — ABNORMAL HIGH (ref 4.0–10.5)
nRBC: 0 % (ref 0.0–0.2)
nRBC: 0 % (ref 0.0–0.2)

## 2020-09-25 LAB — TYPE AND SCREEN
ABO/RH(D): O POS
Antibody Screen: NEGATIVE

## 2020-09-25 LAB — RPR: RPR Ser Ql: NONREACTIVE

## 2020-09-25 SURGERY — Surgical Case
Anesthesia: General

## 2020-09-25 MED ORDER — MORPHINE SULFATE (PF) 0.5 MG/ML IJ SOLN
INTRAMUSCULAR | Status: AC
  Filled 2020-09-25: qty 10

## 2020-09-25 MED ORDER — OXYTOCIN-SODIUM CHLORIDE 30-0.9 UT/500ML-% IV SOLN
INTRAVENOUS | Status: AC
  Filled 2020-09-25: qty 500

## 2020-09-25 MED ORDER — ACETAMINOPHEN 160 MG/5ML PO SOLN: 1000.0000 mg | Freq: Once | ORAL | Status: AC

## 2020-09-25 MED ORDER — TERBUTALINE SULFATE 1 MG/ML IJ SOLN: 0.2500 mg | Freq: Once | INTRAMUSCULAR | Status: DC | PRN

## 2020-09-25 MED ORDER — LACTATED RINGERS IV SOLN: 500.0000 mL | Freq: Once | INTRAVENOUS | Status: DC

## 2020-09-25 MED ORDER — MENTHOL 3 MG MT LOZG: 1.0000 | LOZENGE | OROMUCOSAL | Status: DC | PRN

## 2020-09-25 MED ORDER — SODIUM CHLORIDE 0.9% FLUSH: 3.0000 mL | INTRAVENOUS | Status: DC | PRN

## 2020-09-25 MED ORDER — PRENATAL MULTIVITAMIN CH
1.0000 | ORAL_TABLET | Freq: Every day | ORAL | Status: DC
  Administered 2020-09-26: 1 via ORAL
  Filled 2020-09-25: qty 1

## 2020-09-25 MED ORDER — LACTATED RINGERS IV SOLN: 500.0000 mL | INTRAVENOUS | Status: DC | PRN

## 2020-09-25 MED ORDER — LACTATED RINGERS IV SOLN: INTRAVENOUS | Status: DC

## 2020-09-25 MED ORDER — BUTORPHANOL TARTRATE 1 MG/ML IJ SOLN: 1.0000 mg | INTRAMUSCULAR | Status: DC | PRN

## 2020-09-25 MED ORDER — DEXAMETHASONE SODIUM PHOSPHATE 10 MG/ML IJ SOLN
INTRAMUSCULAR | Status: DC | PRN
  Administered 2020-09-25: 10 mg via INTRAVENOUS

## 2020-09-25 MED ORDER — LACTATED RINGERS IV SOLN: INTRAVENOUS | Status: DC | PRN

## 2020-09-25 MED ORDER — DIPHENHYDRAMINE HCL 25 MG PO CAPS: 25.0000 mg | ORAL_CAPSULE | Freq: Four times a day (QID) | ORAL | Status: DC | PRN

## 2020-09-25 MED ORDER — SIMETHICONE 80 MG PO CHEW: 80.0000 mg | CHEWABLE_TABLET | ORAL | Status: DC | PRN

## 2020-09-25 MED ORDER — DIPHENHYDRAMINE HCL 50 MG/ML IJ SOLN: 12.5000 mg | Freq: Four times a day (QID) | INTRAMUSCULAR | Status: DC | PRN

## 2020-09-25 MED ORDER — NALOXONE HCL 4 MG/10ML IJ SOLN
1.0000 ug/kg/h | INTRAVENOUS | Status: DC | PRN
  Filled 2020-09-25: qty 5

## 2020-09-25 MED ORDER — SENNOSIDES-DOCUSATE SODIUM 8.6-50 MG PO TABS
2.0000 | ORAL_TABLET | ORAL | Status: DC
  Administered 2020-09-26: 2 via ORAL
  Filled 2020-09-25 (×2): qty 2

## 2020-09-25 MED ORDER — COCONUT OIL OIL
1.0000 | TOPICAL_OIL | Status: DC | PRN
Start: 2020-09-25 — End: 2020-09-27

## 2020-09-25 MED ORDER — ACETAMINOPHEN 500 MG PO TABS
1000.0000 mg | ORAL_TABLET | Freq: Once | ORAL | Status: AC
  Administered 2020-09-25: 1000 mg via ORAL

## 2020-09-25 MED ORDER — FENTANYL CITRATE (PF) 100 MCG/2ML IJ SOLN: 25.0000 ug | INTRAMUSCULAR | Status: DC | PRN

## 2020-09-25 MED ORDER — ACETAMINOPHEN 325 MG PO TABS: 650.0000 mg | ORAL_TABLET | ORAL | Status: DC | PRN

## 2020-09-25 MED ORDER — IBUPROFEN 600 MG PO TABS: 600.0000 mg | ORAL_TABLET | Freq: Four times a day (QID) | ORAL | Status: DC | PRN

## 2020-09-25 MED ORDER — BUPROPION HCL ER (XL) 150 MG PO TB24
150.0000 mg | ORAL_TABLET | Freq: Every day | ORAL | Status: DC
  Filled 2020-09-25 (×3): qty 1

## 2020-09-25 MED ORDER — LIDOCAINE HCL (PF) 1 % IJ SOLN: 30.0000 mL | INTRAMUSCULAR | Status: DC | PRN

## 2020-09-25 MED ORDER — FENTANYL CITRATE (PF) 100 MCG/2ML IJ SOLN
INTRAMUSCULAR | Status: AC
  Filled 2020-09-25: qty 2

## 2020-09-25 MED ORDER — IBUPROFEN 600 MG PO TABS
600.0000 mg | ORAL_TABLET | Freq: Four times a day (QID) | ORAL | Status: DC | PRN
Start: 2020-09-25 — End: 2020-09-27
  Administered 2020-09-26 – 2020-09-27 (×4): 600 mg via ORAL
  Filled 2020-09-25 (×5): qty 1

## 2020-09-25 MED ORDER — OXYCODONE-ACETAMINOPHEN 5-325 MG PO TABS
2.0000 | ORAL_TABLET | ORAL | Status: DC | PRN
Start: 2020-09-25 — End: 2020-09-25

## 2020-09-25 MED ORDER — FENTANYL-BUPIVACAINE-NACL 0.5-0.125-0.9 MG/250ML-% EP SOLN
12.0000 mL/h | EPIDURAL | Status: DC | PRN
  Administered 2020-09-25: 12 mL/h via EPIDURAL
  Filled 2020-09-25: qty 250

## 2020-09-25 MED ORDER — NALBUPHINE HCL 10 MG/ML IJ SOLN
5.0000 mg | Freq: Once | INTRAMUSCULAR | Status: DC | PRN
Start: 2020-09-25 — End: 2020-09-27

## 2020-09-25 MED ORDER — SODIUM CHLORIDE 0.9 % IV SOLN
INTRAVENOUS | Status: AC
  Filled 2020-09-25: qty 2

## 2020-09-25 MED ORDER — KETOROLAC TROMETHAMINE 30 MG/ML IJ SOLN: 30.0000 mg | Freq: Once | INTRAMUSCULAR | Status: DC

## 2020-09-25 MED ORDER — OXYCODONE-ACETAMINOPHEN 5-325 MG PO TABS: 1.0000 | ORAL_TABLET | ORAL | Status: DC | PRN

## 2020-09-25 MED ORDER — ACETAMINOPHEN 500 MG PO TABS
1000.0000 mg | ORAL_TABLET | Freq: Four times a day (QID) | ORAL | Status: AC
  Administered 2020-09-26 (×3): 1000 mg via ORAL
  Filled 2020-09-25 (×3): qty 2

## 2020-09-25 MED ORDER — LIDOCAINE-EPINEPHRINE (PF) 2 %-1:200000 IJ SOLN
INTRAMUSCULAR | Status: DC | PRN
  Administered 2020-09-25 (×2): 5 mL via EPIDURAL
  Administered 2020-09-25: 2 mL via EPIDURAL

## 2020-09-25 MED ORDER — FENTANYL CITRATE (PF) 100 MCG/2ML IJ SOLN
100.0000 ug | Freq: Once | INTRAMUSCULAR | Status: AC
  Administered 2020-09-25: 100 ug via EPIDURAL

## 2020-09-25 MED ORDER — OXYTOCIN-SODIUM CHLORIDE 30-0.9 UT/500ML-% IV SOLN
2.5000 [IU]/h | INTRAVENOUS | Status: AC
  Administered 2020-09-25: 2.5 [IU]/h via INTRAVENOUS

## 2020-09-25 MED ORDER — OXYTOCIN-SODIUM CHLORIDE 30-0.9 UT/500ML-% IV SOLN: 2.5000 [IU]/h | INTRAVENOUS | Status: DC

## 2020-09-25 MED ORDER — NALBUPHINE HCL 10 MG/ML IJ SOLN: 5.0000 mg | INTRAMUSCULAR | Status: DC | PRN

## 2020-09-25 MED ORDER — ZOLPIDEM TARTRATE 5 MG PO TABS: 5.0000 mg | ORAL_TABLET | Freq: Every evening | ORAL | Status: DC | PRN

## 2020-09-25 MED ORDER — MISOPROSTOL 50MCG HALF TABLET
50.0000 ug | ORAL_TABLET | ORAL | Status: DC
  Administered 2020-09-25 (×2): 50 ug via ORAL
  Filled 2020-09-25 (×2): qty 1

## 2020-09-25 MED ORDER — TETANUS-DIPHTH-ACELL PERTUSSIS 5-2.5-18.5 LF-MCG/0.5 IM SUSY: 0.5000 mL | PREFILLED_SYRINGE | Freq: Once | INTRAMUSCULAR | Status: DC

## 2020-09-25 MED ORDER — SODIUM CHLORIDE 0.9 % IV SOLN
2.0000 g | INTRAVENOUS | Status: AC
  Administered 2020-09-25: 2 g via INTRAVENOUS
  Filled 2020-09-25: qty 2

## 2020-09-25 MED ORDER — EPHEDRINE 5 MG/ML INJ: 10.0000 mg | INTRAVENOUS | Status: DC | PRN

## 2020-09-25 MED ORDER — ONDANSETRON HCL 4 MG/2ML IJ SOLN: 4.0000 mg | Freq: Four times a day (QID) | INTRAMUSCULAR | Status: DC | PRN

## 2020-09-25 MED ORDER — PROMETHAZINE HCL 25 MG/ML IJ SOLN: 6.2500 mg | INTRAMUSCULAR | Status: DC | PRN

## 2020-09-25 MED ORDER — DIBUCAINE (PERIANAL) 1 % EX OINT: 1.0000 "application " | TOPICAL_OINTMENT | CUTANEOUS | Status: DC | PRN

## 2020-09-25 MED ORDER — FERROUS SULFATE 325 (65 FE) MG PO TABS
325.0000 mg | ORAL_TABLET | Freq: Every day | ORAL | Status: DC
  Administered 2020-09-26 – 2020-09-27 (×2): 325 mg via ORAL
  Filled 2020-09-25 (×2): qty 1

## 2020-09-25 MED ORDER — ONDANSETRON HCL 4 MG/2ML IJ SOLN
INTRAMUSCULAR | Status: AC
  Filled 2020-09-25: qty 2

## 2020-09-25 MED ORDER — SIMETHICONE 80 MG PO CHEW
80.0000 mg | CHEWABLE_TABLET | Freq: Three times a day (TID) | ORAL | Status: DC
  Administered 2020-09-26 – 2020-09-27 (×4): 80 mg via ORAL
  Filled 2020-09-25 (×4): qty 1

## 2020-09-25 MED ORDER — MORPHINE SULFATE (PF) 0.5 MG/ML IJ SOLN
INTRAMUSCULAR | Status: DC | PRN
  Administered 2020-09-25: 3 mg via EPIDURAL

## 2020-09-25 MED ORDER — SOD CITRATE-CITRIC ACID 500-334 MG/5ML PO SOLN
30.0000 mL | ORAL | Status: DC | PRN
  Administered 2020-09-25: 30 mL via ORAL
  Filled 2020-09-25: qty 15

## 2020-09-25 MED ORDER — WITCH HAZEL-GLYCERIN EX PADS: 1.0000 "application " | MEDICATED_PAD | CUTANEOUS | Status: DC | PRN

## 2020-09-25 MED ORDER — DIPHENHYDRAMINE HCL 50 MG/ML IJ SOLN: 12.5000 mg | INTRAMUSCULAR | Status: DC | PRN

## 2020-09-25 MED ORDER — OXYTOCIN-SODIUM CHLORIDE 30-0.9 UT/500ML-% IV SOLN
INTRAVENOUS | Status: DC | PRN
  Administered 2020-09-25: 30 [IU] via INTRAVENOUS

## 2020-09-25 MED ORDER — OXYTOCIN-SODIUM CHLORIDE 30-0.9 UT/500ML-% IV SOLN
1.0000 m[IU]/min | INTRAVENOUS | Status: DC
  Administered 2020-09-25: 2 m[IU]/min via INTRAVENOUS
  Filled 2020-09-25: qty 500

## 2020-09-25 MED ORDER — DEXAMETHASONE SODIUM PHOSPHATE 10 MG/ML IJ SOLN
INTRAMUSCULAR | Status: AC
  Filled 2020-09-25: qty 1

## 2020-09-25 MED ORDER — PHENYLEPHRINE 40 MCG/ML (10ML) SYRINGE FOR IV PUSH (FOR BLOOD PRESSURE SUPPORT): 80.0000 ug | PREFILLED_SYRINGE | INTRAVENOUS | Status: DC | PRN

## 2020-09-25 MED ORDER — OXYTOCIN BOLUS FROM INFUSION: 333.0000 mL | Freq: Once | INTRAVENOUS | Status: DC

## 2020-09-25 MED ORDER — ACETAMINOPHEN 325 MG PO TABS
650.0000 mg | ORAL_TABLET | ORAL | Status: DC | PRN
  Administered 2020-09-26 – 2020-09-27 (×3): 650 mg via ORAL
  Filled 2020-09-25 (×3): qty 2

## 2020-09-25 MED ORDER — ACETAMINOPHEN 500 MG PO TABS
ORAL_TABLET | ORAL | Status: AC
  Filled 2020-09-25: qty 2

## 2020-09-25 MED ORDER — MEASLES, MUMPS & RUBELLA VAC IJ SOLR: 0.5000 mL | Freq: Once | INTRAMUSCULAR | Status: DC

## 2020-09-25 MED ORDER — ONDANSETRON HCL 4 MG/2ML IJ SOLN
4.0000 mg | Freq: Three times a day (TID) | INTRAMUSCULAR | Status: DC | PRN
  Administered 2020-09-26: 4 mg via INTRAVENOUS
  Filled 2020-09-25: qty 2

## 2020-09-25 MED ORDER — NALOXONE HCL 0.4 MG/ML IJ SOLN: 0.4000 mg | INTRAMUSCULAR | Status: DC | PRN

## 2020-09-25 MED ORDER — LIDOCAINE HCL (PF) 1 % IJ SOLN
INTRAMUSCULAR | Status: DC | PRN
  Administered 2020-09-25: 5 mL via EPIDURAL

## 2020-09-25 MED ORDER — ONDANSETRON HCL 4 MG/2ML IJ SOLN
INTRAMUSCULAR | Status: DC | PRN
  Administered 2020-09-25: 4 mg via INTRAVENOUS

## 2020-09-25 MED ORDER — OXYCODONE-ACETAMINOPHEN 5-325 MG PO TABS
1.0000 | ORAL_TABLET | ORAL | Status: DC | PRN
Start: 2020-09-25 — End: 2020-09-25

## 2020-09-25 SURGICAL SUPPLY — 27 items
CHLORAPREP W/TINT 26ML (MISCELLANEOUS) ×2 IMPLANT
CLAMP CORD UMBIL (MISCELLANEOUS) IMPLANT
CLOTH BEACON ORANGE TIMEOUT ST (SAFETY) ×2 IMPLANT
DRSG OPSITE POSTOP 4X10 (GAUZE/BANDAGES/DRESSINGS) ×2 IMPLANT
ELECT REM PT RETURN 9FT ADLT (ELECTROSURGICAL) ×2
ELECTRODE REM PT RTRN 9FT ADLT (ELECTROSURGICAL) ×1 IMPLANT
EXTRACTOR VACUUM M CUP 4 TUBE (SUCTIONS) IMPLANT
GLOVE BIOGEL PI IND STRL 7.0 (GLOVE) ×1 IMPLANT
GLOVE BIOGEL PI INDICATOR 7.0 (GLOVE) ×1
GLOVE SURG ORTHO 8.0 STRL STRW (GLOVE) ×2 IMPLANT
GOWN STRL REUS W/TWL LRG LVL3 (GOWN DISPOSABLE) ×4 IMPLANT
KIT ABG SYR 3ML LUER SLIP (SYRINGE) ×2 IMPLANT
NEEDLE HYPO 25X5/8 SAFETYGLIDE (NEEDLE) ×2 IMPLANT
NS IRRIG 1000ML POUR BTL (IV SOLUTION) ×2 IMPLANT
PACK C SECTION WH (CUSTOM PROCEDURE TRAY) ×2 IMPLANT
PAD OB MATERNITY 4.3X12.25 (PERSONAL CARE ITEMS) ×2 IMPLANT
PENCIL SMOKE EVAC W/HOLSTER (ELECTROSURGICAL) ×2 IMPLANT
SUT MNCRL 0 VIOLET CTX 36 (SUTURE) ×4 IMPLANT
SUT MON AB 4-0 PS1 27 (SUTURE) ×2 IMPLANT
SUT MONOCRYL 0 CTX 36 (SUTURE) ×4
SUT PDS AB 1 CT  36 (SUTURE) ×1
SUT PDS AB 1 CT 36 (SUTURE) ×1 IMPLANT
SUT VIC AB 1 CTX 36 (SUTURE)
SUT VIC AB 1 CTX36XBRD ANBCTRL (SUTURE) IMPLANT
TOWEL OR 17X24 6PK STRL BLUE (TOWEL DISPOSABLE) ×2 IMPLANT
TRAY FOLEY W/BAG SLVR 14FR LF (SET/KITS/TRAYS/PACK) ×2 IMPLANT
WATER STERILE IRR 1000ML POUR (IV SOLUTION) ×2 IMPLANT

## 2020-09-25 NOTE — Op Note (Signed)
Cesarean Section Procedure Note  Pre-operative Diagnosis: IUP at 38 weeks, Fetal intolerance to labor  Post-operative Diagnosis: same plus asynclitic presentation  Surgeon: Pacey Altizer C   Assistants: none  Anesthesia: epidural  Procedure:  Low Segment Transverse cesarean section  Procedure Details  The patient was seen in the Holding Room. The risks, benefits, complications, treatment options, and expected outcomes were discussed with the patient.  The patient concurred with the proposed plan, giving informed consent.  The site of surgery properly noted/marked.. A Time Out was held and the above information confirmed.  After induction of anesthesia, the patient was draped and prepped in the usual sterile manner. A Pfannenstiel incision was made and carried down through the subcutaneous tissue to the fascia. Fascial incision was made and extended transversely. The fascia was separated from the underlying rectus tissue superiorly and inferiorly. The peritoneum was identified and entered. Peritoneal incision was extended longitudinally. The utero-vesical peritoneal reflection was incised transversely and the bladder flap was bluntly freed from the lower uterine segment. A low transverse uterine incision was made. Delivered from vertex presentation was a baby with Apgar scores of 8 at one minute and 9 at five minutes. After the umbilical cord was clamped and cut cord blood was obtained for evaluation. The placenta was removed intact and appeared normal. The uterine outline, tubes and ovaries appeared normal. The uterine incision was closed with running locked sutures of 0 monocryl and imbricated with 0 monocryl. Hemostasis was observed. Lavage was carried out until clear. The peritoneum was then closed with 0 monocryl and rectus muscles plicated in the midline.  After hemostasis was assured, the fascia was then reapproximated with running sutures of 0 PDS. Irrigation was applied and after adequate  hemostasis was assured, the skin was reapproximated with subcutaneous sutures using 4-0 monocryl.  Instrument, sponge, and needle counts were correct prior the abdominal closure and at the conclusion of the case. The patient received 2 grams cefotetan preoperatively.  Findings: Viable  female  Estimated Blood Loss:  812cc         Specimens: Placenta was sent to labor and delivery         Complications:  None

## 2020-09-25 NOTE — Progress Notes (Signed)
Patient ID: Joanna Walsh, female   DOB: 18-Jan-1996, 25 y.o.   MRN: [id number] [name] is comfortable with epidural VSSAF FHR recurrent late decels when adequate MVU Cat 2 now Ctxs q 2-4 minutes MVU 180-300  Cx 1/80/-3 Previously had to stop Pitocin when recurrent late decels and did recover with pit off.  Since restarting to achieve adequate labor, the late decels have returned   Fetal intolerance to labor Rec primary C/S Risks and benefits of C/S were discussed.  All questions were answered and informed consent was obtained.  Plan to proceed with low segment transverse Cesarean Section.

## 2020-09-25 NOTE — Anesthesia Postprocedure Evaluation (Signed)
Anesthesia Post Note  Patient: Joanna Walsh  Procedure(s) Performed: CESAREAN SECTION (N/A )     Patient location during evaluation: PACU Anesthesia Type: Epidural Level of consciousness: awake and alert and oriented Pain management: pain level controlled Vital Signs Assessment: post-procedure vital signs reviewed and stable Respiratory status: spontaneous breathing, nonlabored ventilation and respiratory function stable Cardiovascular status: blood pressure returned to baseline Postop Assessment: epidural receding, no apparent nausea or vomiting, no headache and no backache Anesthetic complications: no   No complications documented.  Last Vitals:  Vitals:   09/25/20 2000 09/25/20 2025  BP: 117/72 108/70  Pulse: 96 98  Resp: 17 18  Temp:  37.1 C  SpO2: 99% 98%    Last Pain:  Vitals:   09/25/20 2025  TempSrc:   PainSc: 0-No pain   Pain Goal:    LLE Motor Response: Purposeful movement (09/25/20 2000) LLE Sensation: Tingling,Increased (09/25/20 2000) RLE Motor Response: Purposeful movement (09/25/20 2000) RLE Sensation: Tingling,Increased (09/25/20 2000)     Epidural/Spinal Function Cutaneous sensation: Tingles (09/25/20 2000), Patient able to flex knees: No (09/25/20 2000), Patient able to lift hips off bed: No (09/25/20 2000), Back pain beyond tenderness at insertion site: No (09/25/20 2000), Progressively worsening motor and/or sensory loss: No (09/25/20 2000), Bowel and/or bladder incontinence post epidural: No (09/25/20 2000)  Kaylyn Layer

## 2020-09-25 NOTE — Anesthesia Preprocedure Evaluation (Addendum)
Anesthesia Evaluation  Patient identified by MRN, date of birth, ID band Patient awake    Reviewed: Allergy & Precautions, NPO status , Patient's Chart, lab work & pertinent test results  History of Anesthesia Complications Negative for: history of anesthetic complications  Airway Mallampati: III  TM Distance: >3 FB Neck ROM: Full    Dental no notable dental hx.    Pulmonary neg pulmonary ROS,    Pulmonary exam normal breath sounds clear to auscultation       Cardiovascular hypertension (gestational), Normal cardiovascular exam Rhythm:Regular Rate:Normal     Neuro/Psych Depression negative neurological ROS     GI/Hepatic negative GI ROS, Neg liver ROS,   Endo/Other  Morbid obesity  Renal/GU negative Renal ROS  negative genitourinary   Musculoskeletal negative musculoskeletal ROS (+)   Abdominal   Peds negative pediatric ROS (+)  Hematology negative hematology ROS (+)   Anesthesia Other Findings   Reproductive/Obstetrics (+) Pregnancy                            Anesthesia Physical Anesthesia Plan  ASA: III and emergent  Anesthesia Plan: Epidural   Post-op Pain Management:    Induction: Intravenous  PONV Risk Score and Plan: 2 and Treatment may vary due to age or medical condition, Dexamethasone and Ondansetron  Airway Management Planned: Natural Airway  Additional Equipment:   Intra-op Plan:   Post-operative Plan:   Informed Consent: I have reviewed the patients History and Physical, chart, labs and discussed the procedure including the risks, benefits and alternatives for the proposed anesthesia with the patient or authorized representative who has indicated his/her understanding and acceptance.       Plan Discussed with: Anesthesiologist  Anesthesia Plan Comments: (Epidural to be used for urgent C/S (NRFHT). Patient had crackers at 1400 today. Discussed NPO status  with surgeon and decision made to proceed despite inadequate NPO status. Stephannie Peters, MD)      Anesthesia Quick Evaluation

## 2020-09-25 NOTE — Anesthesia Procedure Notes (Signed)
Epidural Patient location during procedure: OB Start time: 09/25/2020 11:50 AM End time: 09/25/2020 12:00 PM  Staffing Anesthesiologist: Mellody Dance, MD Performed: anesthesiologist   Preanesthetic Checklist Completed: patient identified, IV checked, site marked, risks and benefits discussed, monitors and equipment checked, pre-op evaluation and timeout performed  Epidural Patient position: sitting Prep: DuraPrep Patient monitoring: heart rate, cardiac monitor, continuous pulse ox and blood pressure Approach: midline Location: L3-L4 Injection technique: LOR saline  Needle:  Needle type: Tuohy  Needle gauge: 17 G Needle length: 9 cm Needle insertion depth: 6 cm Catheter type: closed end flexible Catheter size: 20 Guage Catheter at skin depth: 11 cm Test dose: negative and Other  Assessment Events: blood not aspirated, injection not painful, no injection resistance and negative IV test  Additional Notes Informed consent obtained prior to proceeding including risk of failure, 1% risk of PDPH, risk of minor discomfort and bruising.  Discussed rare but serious complications including epidural abscess, permanent nerve injury, epidural hematoma.  Discussed alternatives to epidural analgesia and patient desires to proceed.  Timeout performed pre-procedure verifying patient name, procedure, and platelet count.  Patient tolerated procedure well.

## 2020-09-25 NOTE — Lactation Note (Signed)
This note was copied from a baby's chart. Lactation Consultation Note  Patient Name: Boy Alessia Gonsalez [id number] Date: 09/25/2020 Reason for consult: Initial assessment;Primapara;1st time breastfeeding;Early term 37-38.6wks Age:24 hours  Visited with mom of 3 hours old ETI female, she's a P1 and reported (++) breast changes during the pregnancy. LC and NICU RN Marchelle Folks (shadow) assisted with hand expression and mom was able to get small droplets of colostrum, praised her for her efforts; LC showed parents how to finger feed baby. Noticed that mom has flat nipples, but her tissue is somehow compressible.  Offered assistance with latch, but mom politely declined, stating that baby just fed, he was asleep and didn't want to wake him up. Asked mom to call for assistance when needed. Reviewed normal newborn behavior, feeding cues, size of baby's stomach and pumping schedule.  Feeding plan:  1. Encouraged mom to feed baby STS 8-12 times/24 hours or sooner if feeding cues are present 2. She'll pre-pump for 3-5 minutes prior feedings 3. She'll start wearing her breast shells tomorrow, daytime only  BF brochure, BF resources and feeding diary were reviewed. FOB present and supportive. Parents reported all questions and concerns were answered, they're both aware of LC OP services and will call PRN.   Maternal Data Has patient been taught Hand Expression?: Yes Does the patient have breastfeeding experience prior to this delivery?: No  Feeding Mother's Current Feeding Choice: Breast Milk  LATCH Score Latch: Repeated attempts needed to sustain latch, nipple held in mouth throughout feeding, stimulation needed to elicit sucking reflex.  Audible Swallowing: Spontaneous and intermittent  Type of Nipple: Flat  Comfort (Breast/Nipple): Soft / non-tender  Hold (Positioning): Full assist, staff holds infant at breast  LATCH Score: 6   Lactation Tools Discussed/Used Tools: Shells;Pump Breast pump type:  Manual Pump Education: Setup, frequency, and cleaning;Milk Storage Reason for Pumping: flat nipples Pumping frequency: prior feedings  Interventions Interventions: Breast feeding basics reviewed;Breast massage;Hand express;Education;Shells;Hand pump;Reverse pressure  Discharge Pump: Personal;Manual (Spectra DEBP at home) [facility] Program: No  Consult Status Consult Status: Follow-up Date: 09/26/20 Follow-up type: In-patient    Wilber Fini [name] 09/25/2020, 9:36 PM

## 2020-09-25 NOTE — H&P (Signed)
Joanna Walsh is a 25 y.o. female presenting for induction of labor for mild gestational HTN.  No severe features.  GBS-. OB History    Gravida  1   Para      Term      Preterm      AB      Living        SAB      IAB      Ectopic      Multiple      Live Births  0          Past Medical History:  Diagnosis Date  . Chicken pox   . Depression   . Pregnancy induced hypertension    Past Surgical History:  Procedure Laterality Date  . NO PAST SURGERIES    . WISDOM TOOTH EXTRACTION     Family History: family history includes Heart disease in her maternal grandfather; Prostate cancer in her maternal grandfather. Social History:  reports that she has never smoked. She has never used smokeless tobacco. She reports that she does not drink alcohol and does not use drugs.     Maternal Diabetes: No Genetic Screening: Normal Maternal Ultrasounds/Referrals: Normal Fetal Ultrasounds or other Referrals:  None Maternal Substance Abuse:  No Significant Maternal Medications:  None Significant Maternal Lab Results:  Group B Strep negative Other Comments:  None  Review of Systems History Dilation: 1 Effacement (%): 50 Station: -3 Exam by:: Jayston Trevino Blood pressure 119/69, pulse 100, temperature 98.2 F (36.8 C), resp. rate 18, height 4\' 11"  (1.499 m), weight 94.7 kg, last menstrual period 10/18/2019. Exam Physical Exam  Vitals and nursing note reviewed. Exam conducted with a chaperone present.  Constitutional:      Appearance: Normal appearance.  HENT:     Head: Normocephalic.  Eyes:     Pupils: Pupils are equal, round, and reactive to light.  Cardiovascular:     Rate and Rhythm: Normal rate and regular rhythm.     Pulses: Normal pulses.  Abdominal:     General: Abdomen is Gravid, nontender Neurological:     Mental Status: She is alert.DTRS 1/4 no clonus Prenatal labs: ABO, Rh: --/--/O POS (06/02 0114) Antibody: NEG (06/02 0114) Rubella: Immune (11/02 0000) RPR:  Nonreactive (11/02 0000)  HBsAg: Negative (11/02 0000)  HIV: Non-reactive (11/02 0000)  GBS: Negative/-- (05/20 0000)   Assessment/Plan: IUP at term Mild Gestational HTN S/P cytotec x 2.  AROM clear and will augment with Pit prn Anticipate SVD   10-21-1982 09/25/2020, 9:13 AM

## 2020-09-25 NOTE — Transfer of Care (Signed)
Immediate Anesthesia Transfer of Care Note  Patient: Joanna Walsh  Procedure(s) Performed: CESAREAN SECTION (N/A )  Patient Location: PACU  Anesthesia Type:Epidural  Level of Consciousness: awake, alert , oriented and patient cooperative  Airway & Oxygen Therapy: Patient Spontanous Breathing  Post-op Assessment: Report given to RN and Post -op Vital signs reviewed and stable  Post vital signs: Reviewed and stable  Last Vitals:  Vitals Value Taken Time  BP 110/59 09/25/20 1915  Temp    Pulse 104 09/25/20 1917  Resp 20 09/25/20 1917  SpO2 100 % 09/25/20 1917  Vitals shown include unvalidated device data.  Last Pain:  Vitals:   09/25/20 1551  TempSrc: Oral  PainSc:          Complications: No complications documented.

## 2020-09-26 ENCOUNTER — Encounter (HOSPITAL_COMMUNITY): Payer: Self-pay | Admitting: Obstetrics and Gynecology

## 2020-09-26 LAB — CBC
HCT: 32.7 % — ABNORMAL LOW (ref 36.0–46.0)
Hemoglobin: 10.8 g/dL — ABNORMAL LOW (ref 12.0–15.0)
MCH: 29.8 pg (ref 26.0–34.0)
MCHC: 33 g/dL (ref 30.0–36.0)
MCV: 90.3 fL (ref 80.0–100.0)
Platelets: 223 10*3/uL (ref 150–400)
RBC: 3.62 MIL/uL — ABNORMAL LOW (ref 3.87–5.11)
RDW: 13.3 % (ref 11.5–15.5)
WBC: 27.2 10*3/uL — ABNORMAL HIGH (ref 4.0–10.5)
nRBC: 0 % (ref 0.0–0.2)

## 2020-09-26 NOTE — Progress Notes (Signed)
POD # 1  Doing well  BP 130/69 (BP Location: Left Arm)   Pulse 91   Temp 97.8 F (36.6 C) (Axillary)   Resp 18   Ht 4\' 11"  (1.499 m)   Wt 94.7 kg   LMP 10/18/2019 Comment: irregular  SpO2 97%   Breastfeeding Unknown   BMI 42.17 kg/m  Results for orders placed or performed during the hospital encounter of 09/25/20 (from the past 24 hour(s))  CBC     Status: Abnormal   Collection Time: 09/25/20 10:47 AM  Result Value Ref Range   WBC 15.5 (H) 4.0 - 10.5 K/uL   RBC 3.83 (L) 3.87 - 5.11 MIL/uL   Hemoglobin 11.4 (L) 12.0 - 15.0 g/dL   HCT 11/25/20 (L) 27.0 - 78.6 %   MCV 90.1 80.0 - 100.0 fL   MCH 29.8 26.0 - 34.0 pg   MCHC 33.0 30.0 - 36.0 g/dL   RDW 75.4 49.2 - 01.0 %   Platelets 189 150 - 400 K/uL   nRBC 0.0 0.0 - 0.2 %  CBC     Status: Abnormal   Collection Time: 09/26/20  5:16 AM  Result Value Ref Range   WBC 27.2 (H) 4.0 - 10.5 K/uL   RBC 3.62 (L) 3.87 - 5.11 MIL/uL   Hemoglobin 10.8 (L) 12.0 - 15.0 g/dL   HCT 11/26/20 (L) 21.9 - 75.8 %   MCV 90.3 80.0 - 100.0 fL   MCH 29.8 26.0 - 34.0 pg   MCHC 33.0 30.0 - 36.0 g/dL   RDW 83.2 54.9 - 82.6 %   Platelets 223 150 - 400 K/uL   nRBC 0.0 0.0 - 0.2 %   Abdomen is soft and minimally tender  POD # 1  Doing well Routine care Discharge home possibly tomorrow

## 2020-09-26 NOTE — Social Work (Signed)
CSW received consult for hx of Anxiety and Depression.  CSW met with MOB to offer support and complete assessment.    CSW met with MOB at bedside. FOB present at bedside. CSW congratulated MOB and FOB. CSW observed MOB skin to skin with the infant. CSW introduced CSW role and offered MOB privacy. MOB preferred FOB stays during the assessment. CSW inquired how MOB had felt since giving birth. MOB report feeling pretty good. MOB reports she was not expecting to have a c-section, so still adjusting after the procedure.   CSW inquired about MOB history of depression and anxiety. MOB reports she was diagnosed with depression and anxiety as a teenager. MOB reports she was prescribed Zoloft at the time but felt it did not work. MOB report about a year ago she started feeling more anxious and depressed. MOB reports she was under a lot of stress last year working fulltime and going to school fulltime. MOB reports she felt serious burnout and overly stressed. MOB reports she talked with her primary physician and was prescribed Wellbutrin, which she took for about 2-3 months. MOB reports the medication made her have a manic episode, so she slowly stopped taking the medication after feeling more like herself. CSW inquired if MOB has received therapy. MOB reports she is open to seeing a therapist however she is not sure if her insurance will cover it. CSW provided education regarding the baby blues period vs. perinatal mood disorders, discussed treatment and gave resources for mental health follow up if concerns arise.   CSW inquired about MOB coping skills. MOB report she likes to engage in yoga, keep clean place and hygiene, and practice restful thinking. CSW praised MOB for her coping skills. CSW assessed MOB for safety. MOB reports no thoughts of harm to self and others. CSW inquired about MOB supports. MOB acknowledges her parents, extended family and friends. MOB reports they are live very in close in the community  where she lives.   CSW recommends self-evaluation during the postpartum time period using the New Mom Checklist from Postpartum Progress and encouraged MOB to contact a medical professional if symptoms are noted. MOB receptive to the resources provided.   CSW provided review of Sudden Infant Death Syndrome (SIDS) precautions and informed MOB no-co sleeping with the infant. MOB reports the infant will sleep in a pack n play. MOB reports she has essential items for the infant including cars eat. MOB reports she has chosen [facility]. CSW assessed MOB for further needs. MOB reports additional need.   CSW identifies no further need for intervention and no barriers to discharge at this time.  Justyn Boyson, MSW, LCSW [facility]  Clinical Social Worker  [phone] 09/26/2020  1:25 PM  

## 2020-09-27 MED ORDER — DOCUSATE SODIUM 100 MG PO CAPS: 100.0000 mg | ORAL_CAPSULE | Freq: Two times a day (BID) | ORAL | 2 refills | Status: DC

## 2020-09-27 MED ORDER — OXYCODONE HCL 5 MG PO TABS: 5.0000 mg | ORAL_TABLET | ORAL | 0 refills | Status: DC | PRN

## 2020-09-27 MED ORDER — IBUPROFEN 600 MG PO TABS: 600.0000 mg | ORAL_TABLET | Freq: Four times a day (QID) | ORAL | 0 refills | Status: DC | PRN

## 2020-09-27 NOTE — Lactation Note (Signed)
This note was copied from a baby's chart. Lactation Consultation Note Mom having difficulty latching. Mom has flat nipples. Baby having difficulty obtaining deep latch. Baby fussy searching for nipple. Baby has high palate, possible tight frenulum. Baby needs nipple and areola fished into mouth. Encouraged mom to talk to baby as she is trying to latch and BF.  LC assisted w/latching.  Newborn feeding habits, behavior, STS, I&O, breast massage, positioning, support, supply and demand discussed. Encouraged mom to call for assistance or questions.  Patient Name: Boy Lyrika Souders [id number] Date: 09/27/2020 Reason for consult: Primapara;Early term 37-38.6wks;Follow-up assessment;Difficult latch Age:63 hours  Maternal Data Has patient been taught Hand Expression?: Yes Does the patient have breastfeeding experience prior to this delivery?: No  Feeding    LATCH Score Latch: Grasps breast easily, tongue down, lips flanged, rhythmical sucking.  Audible Swallowing: A few with stimulation  Type of Nipple: Flat  Comfort (Breast/Nipple): Soft / non-tender  Hold (Positioning): Assistance needed to correctly position infant at breast and maintain latch.  LATCH Score: 7   Lactation Tools Discussed/Used Tools: Shells;Pump Breast pump type: Manual (mom declined DEBP at this time)  Interventions Interventions: Breast feeding basics reviewed;Support pillows;Assisted with latch;Position options;Skin to skin;Breast massage;Hand express;Shells;Pre-pump if needed;Hand pump;Adjust position;Breast compression  Discharge    Consult Status Consult Status: Follow-up Date: 09/27/20 Follow-up type: In-patient    Charyl Dancer 09/27/2020, 1:36 AM

## 2020-09-27 NOTE — Plan of Care (Signed)
  Problem: Education: Goal: Knowledge of condition will improve Outcome: Completed/Met   Problem: Activity: Goal: Will verbalize the importance of balancing activity with adequate rest periods Outcome: Completed/Met Goal: Ability to tolerate increased activity will improve Outcome: Completed/Met   Problem: Coping: Goal: Ability to identify and utilize available resources and services will improve Outcome: Completed/Met   Problem: Life Cycle: Goal: Chance of risk for complications during the postpartum period will decrease Outcome: Completed/Met   Problem: Role Relationship: Goal: Ability to demonstrate positive interaction with newborn will improve Outcome: Completed/Met   

## 2020-09-27 NOTE — Lactation Note (Signed)
This note was copied from a baby's chart. Lactation Consultation Note  Patient Name: Joanna Walsh [id number] Date: 09/27/2020 Reason for consult: Follow-up assessment;Hyperbilirubinemia;Primapara;Early term 37-38.6wks;1st time breastfeeding Age:24 hours  Visited with mom of 91 hours old ETI female, she's a P1. Baby was put on double phototherapy this morning, bilirubin is at high risk zone. Baby getting slightly sleepy too when going to breast, but mom willing to work on BF and double pumping.   Parents signed consent for donor milk, LC showed parents how to syringe fed baby, he took 12 ml. Reviewed newborn jaundice, pumping schedule, donor milk, cluster feeding, feeding cues, supplementation guidelines and size of baby's stomach.  Feeding plan:  1. Encouraged mom to continue feeding baby on cues 8-12 times/24 hours or sooner if feeding cues are present 2. Mom will start double pumping every 3 hours or whenever baby is getting donor milk 3. Parents will start supplementing baby with mother's milk/donor milk, starting at 12 ml, but will increase volumes according to formula supplementation guidelines per baby's age in hours  FOB present and supportive. Parents reported all questions and concerns were answered, they're both aware of LC OP services and will call PRN.  Maternal Data    Feeding Mother's Current Feeding Choice: Breast Milk and Donor Milk  LATCH Score                    Lactation Tools Discussed/Used Tools: Pump;Shells Breast pump type: Double-Electric Breast Pump;Manual Pump Education: Setup, frequency, and cleaning;Milk Storage Reason for Pumping: hyperbilirubinemia Pumping frequency: q 3 hours  Interventions Interventions: Breast feeding basics reviewed;Education;DEBP;Hand pump;Shells  Discharge Pump: Manual;DEBP;Personal (Spectra DEBP at home)  Consult Status Consult Status: Follow-up Date: 09/28/20 Follow-up type: In-patient    Joanna Walsh Joanna Walsh 09/27/2020, 11:38 AM

## 2020-09-27 NOTE — Discharge Summary (Signed)
Postpartum Discharge Summary  Date of Service updated 09/27/20     Patient Name: Joanna Walsh DOB: 05/27/1995 MRN: [id number]  Date of admission: 09/25/2020 Delivery date:09/25/2020  Delivering provider: Louretta Shorten  Date of discharge: 09/27/2020  Admitting diagnosis: Encounter for induction of labor [Z34.90] Intrauterine pregnancy: [redacted]w[redacted]d    Secondary diagnosis:  Active Problems:   Encounter for induction of labor  Additional problems: None    Discharge diagnosis: Term Pregnancy Delivered and Gestational Hypertension                                              Post partum procedures:none Augmentation: AROM, Pitocin and Cytotec Complications: None  Hospital course: Induction of Labor With Vaginal Delivery   25y.o. yo G1P1001 at 352w1das admitted to the hospital 09/25/2020 for induction of labor.  Indication for induction: Gestational hypertension.  Patient had an uncomplicated labor course as follows: Membrane Rupture Time/Date: 8:21 AM ,09/25/2020   Delivery Method:C-Section, Low Transverse  Episiotomy: None  Lacerations:  None   She had non reassuring FHT and asynclitic presentation Details of delivery can be found in separate delivery note.  Patient had a routine postpartum course. Patient is discharged home 09/27/20.  Newborn Data: Birth date:09/25/2020  Birth time:6:32 PM  Gender:Female  Living status:Living  Apgars:8 ,9  Weight:3735 g   Magnesium Sulfate received: No BMZ received: No Rhophylac:N/A MMR:N/A T-DaP:Given prenatally Flu: N/A Transfusion:No  Physical exam  Vitals:   09/26/20 0800 09/26/20 1154 09/26/20 2035 09/27/20 0454  BP: 116/63 122/68 115/65 113/71  Pulse: 92 94 93 80  Resp: _0 Temp: 98.9 F (37.2 C) 98.1 F (36.7 C) 98.2 F (36.8 C) 98.2 F (36.8 C)  TempSrc: Oral Oral Oral Oral  SpO2: 96% 97% 99% 100%  Weight:      Height:       General: alert Lochia: appropriate Uterine Fundus: firm Incision: Dressing is clean, dry, and  intact DVT Evaluation: No evidence of DVT seen on physical exam. Labs: Lab Results  Component Value Date   WBC 27.2 (H) 09/26/2020   HGB 10.8 (L) 09/26/2020   HCT 32.7 (L) 09/26/2020   MCV 90.3 09/26/2020   PLT 223 09/26/2020   CMP Latest Ref Rng & Units 10/25/2019  Glucose 70 - 99 mg/dL 91  BUN 6 - 23 mg/dL 15  Creatinine 0.40 - 1.20 mg/dL 0.86  Sodium 135 - 145 mEq/L 138  Potassium 3.5 - 5.1 mEq/L 4.2  Chloride 96 - 112 mEq/L 104  CO2 19 - 32 mEq/L 28  Calcium 8.4 - 10.5 mg/dL 9.7  Total Protein 6.0 - 8.3 g/dL 7.2  Total Bilirubin 0.2 - 1.2 mg/dL 0.8  Alkaline Phos 39 - 117 U/L 51  AST 0 - 37 U/L 19  ALT 0 - 35 U/L 15   Edinburgh Score: Edinburgh Postnatal Depression Scale Screening Tool 09/26/2020  I have been able to laugh and see the funny side of things. 0  I have looked forward with enjoyment to things. 0  I have blamed myself unnecessarily when things went wrong. 1  I have been anxious or worried for no good reason. 0  I have felt scared or panicky for no good reason. 0  Things have been getting on top of me. 0  I have been so unhappy that I have had difficulty  sleeping. 0  I have felt sad or miserable. 0  I have been so unhappy that I have been crying. 0  The thought of harming myself has occurred to me. 0  Edinburgh Postnatal Depression Scale Total 1      After visit meds:  Allergies as of 09/27/2020   No Known Allergies     Medication List    TAKE these medications   buPROPion 150 MG 24 hr tablet Commonly known as: WELLBUTRIN XL TAKE 1 TABLET BY MOUTH EVERY DAY   docusate sodium 100 MG capsule Commonly known as: Colace Take 1 capsule (100 mg total) by mouth 2 (two) times daily.   ferrous sulfate 325 (65 FE) MG tablet Take 325 mg by mouth daily with breakfast.   ibuprofen 600 MG tablet Commonly known as: ADVIL Take 1 tablet (600 mg total) by mouth every 6 (six) hours as needed.   multivitamin-prenatal 27-0.8 MG Tabs tablet Take 1 tablet by mouth  daily at 12 noon.   omeprazole 10 MG capsule Commonly known as: PRILOSEC Take 10 mg by mouth daily.   oxyCODONE 5 MG immediate release tablet Commonly known as: Oxy IR/ROXICODONE Take 1 tablet (5 mg total) by mouth every 4 (four) hours as needed for severe pain.       Declines circ Discharge home in stable condition Infant Feeding: Bottle and Breast Infant Disposition:home with mother Discharge instruction: per After Visit Summary and Postpartum booklet. Activity: Advance as tolerated. Pelvic rest for 6 weeks.  Diet: routine diet Anticipated Birth Control: Unsure Postpartum Appointment:6 weeks Additional Postpartum F/U: BP check 2-3 days Future Appointments:No future appointments. Follow up Visit:      09/27/2020 Tyson Dense, MD

## 2020-09-28 ENCOUNTER — Ambulatory Visit: Payer: Self-pay

## 2020-09-28 NOTE — Lactation Note (Signed)
This note was copied from a baby's chart. Lactation Consultation Note  Patient Name: Boy Joanna Walsh [id number] Date: 09/28/2020 Reason for consult: Follow-up assessment Age:25 years   P1 mother whose infant is now 3 hours old.  This is an ETI at 38+1 weeks.  Mother is planning to pump and bottle feed only.  Parents are awaiting a 1700 bilirubin draw to determine discharge.  Reviewed volume supplementations with parents and encouraged 30+ mls at this time.  Mother reported that baby recently consumed "the whole bottle."  She is happy to be able to visualize the amount he consumes.  Voiding/stooling well.  Mother is familiar with engorgement and needed no review.  She has a manual pump and a DEBP for home use.  She has our OP phone number for any concerns after discharge.   Maternal Data    Feeding    LATCH Score                    Lactation Tools Discussed/Used    Interventions    Discharge Discharge Education: Engorgement and breast care  Consult Status Consult Status: Complete Date: 09/28/20 Follow-up type: Call as needed    Janye Maynor R Meesha Sek 09/28/2020, 2:10 PM

## 2020-09-29 ENCOUNTER — Telehealth: Payer: Self-pay

## 2020-09-29 NOTE — Telephone Encounter (Signed)
Transition Care Management Follow-up Telephone Call  Date of discharge and from where: 09/27/2020 from [facility]  How have you been since you were released from the hospital? Pt states that she is feeling well and has no questions or concerns at this time.   Any questions or concerns? No  Items Reviewed:  Did the pt receive and understand the discharge instructions provided? Yes   Medications obtained and verified? Yes   Other? No   Any new allergies since your discharge? No   Dietary orders reviewed? n/a  Do you have support at home? Yes   Functional Questionnaire: (I = Independent and D = Dependent) ADLs: I  Bathing/Dressing- I  Meal Prep- I  Eating- I  Maintaining continence- I  Transferring/Ambulation- I  Managing Meds- I   Follow up appointments reviewed:   PCP Hospital f/u appt confirmed? No    Specialist Hospital f/u appt confirmed? Yes  Scheduled to see OBGYN for an incision check.   Are transportation arrangements needed? No   If their condition worsens, is the pt aware to call PCP or go to the Emergency Dept.? Yes  Was the patient provided with contact information for the PCP's office or ED? Yes  Was to pt encouraged to call back with questions or concerns? Yes

## 2020-09-29 NOTE — Telephone Encounter (Signed)
Transition Care Management Unsuccessful Follow-up Telephone Call  Date of discharge and from where:  09/27/20 from [facility]  Attempts:  1st Attempt  Reason for unsuccessful TCM follow-up call:  Left voice message

## 2020-10-24 DIAGNOSIS — Z419 Encounter for procedure for purposes other than remedying health state, unspecified: Secondary | ICD-10-CM | POA: Diagnosis not present

## 2020-11-24 DIAGNOSIS — Z419 Encounter for procedure for purposes other than remedying health state, unspecified: Secondary | ICD-10-CM | POA: Diagnosis not present

## 2020-11-27 ENCOUNTER — Encounter: Payer: Self-pay | Admitting: Internal Medicine

## 2020-11-27 ENCOUNTER — Ambulatory Visit
Admission: RE | Admit: 2020-11-27 | Discharge: 2020-11-27 | Disposition: A | Discharge status: Home / Self Care | Payer: Managed Care, Other (non HMO) | Source: Ambulatory Visit | Attending: Internal Medicine | Admitting: Internal Medicine

## 2020-11-27 ENCOUNTER — Other Ambulatory Visit: Payer: Self-pay

## 2020-11-27 ENCOUNTER — Ambulatory Visit
Admission: RE | Admit: 2020-11-27 | Discharge: 2020-11-27 | Disposition: A | Discharge status: Home / Self Care | Payer: Managed Care, Other (non HMO) | Attending: Internal Medicine | Admitting: Internal Medicine

## 2020-11-27 ENCOUNTER — Ambulatory Visit (INDEPENDENT_AMBULATORY_CARE_PROVIDER_SITE_OTHER): Payer: Managed Care, Other (non HMO) | Admitting: Internal Medicine

## 2020-11-27 VITALS — BP 130/73 | HR 81 | Temp 98.2°F | Resp 17 | Ht 59.0 in | Wt 186.2 lb

## 2020-11-27 DIAGNOSIS — Z1159 Encounter for screening for other viral diseases: Secondary | ICD-10-CM

## 2020-11-27 DIAGNOSIS — R5383 Other fatigue: Secondary | ICD-10-CM

## 2020-11-27 DIAGNOSIS — M25561 Pain in right knee: Secondary | ICD-10-CM | POA: Insufficient documentation

## 2020-11-27 DIAGNOSIS — Z8269 Family history of other diseases of the musculoskeletal system and connective tissue: Secondary | ICD-10-CM

## 2020-11-27 DIAGNOSIS — F341 Dysthymic disorder: Secondary | ICD-10-CM

## 2020-11-27 DIAGNOSIS — D500 Iron deficiency anemia secondary to blood loss (chronic): Secondary | ICD-10-CM | POA: Insufficient documentation

## 2020-11-27 DIAGNOSIS — Z6837 Body mass index (BMI) 37.0-37.9, adult: Secondary | ICD-10-CM

## 2020-11-27 DIAGNOSIS — M2550 Pain in unspecified joint: Secondary | ICD-10-CM | POA: Diagnosis not present

## 2020-11-27 DIAGNOSIS — Z0001 Encounter for general adult medical examination with abnormal findings: Secondary | ICD-10-CM

## 2020-11-27 NOTE — Progress Notes (Signed)
Subjective:    Patient ID: Joanna Walsh, female    DOB: [birth year], 25 y.o.   MRN: [id number]  HPI  Pt presents to the clinic today for her annual exam. She is also due to follow up chronic conditions.  Depression: No longer on Bupropion. She is not currently seeing a therapist. She denies SI/HI.  Iron Deficiency Anemia: Her last H/H was 10.8/32.7, 09/2020. She is taking an oral iron supplement. She does not follow with hematology.  Flu: 01/2020 Tetanus: 11/2018 Covid: Pfizer x 2 Pap Smear: [facility] Dentist: biannually  Diet: She does eat. Meat. She consumes fruits and veggies. She tries to avoid fried foods. She drinks mostly water, some Gatorade. Exercise: None  Review of Systems  Past Medical History:  Diagnosis Date   Chicken pox    Depression    Pregnancy induced hypertension     Current Outpatient Medications  Medication Sig Dispense Refill   buPROPion (WELLBUTRIN XL) 150 MG 24 hr tablet TAKE 1 TABLET BY MOUTH EVERY DAY 90 tablet 1   ferrous sulfate 325 (65 FE) MG tablet Take 325 mg by mouth daily with breakfast.     Prenatal Vit-Fe Fumarate-FA (MULTIVITAMIN-PRENATAL) 27-0.8 MG TABS tablet Take 1 tablet by mouth daily at 12 noon.     No current facility-administered medications for this visit.    No Known Allergies  Family History  Problem Relation Age of Onset   Prostate cancer Maternal Grandfather    Heart disease Maternal Grandfather     Social History   Socioeconomic History   Marital status: Single    Spouse name: Not on file   Number of children: Not on file   Years of education: Not on file   Highest education level: Not on file  Occupational History   Not on file  Tobacco Use   Smoking status: Never   Smokeless tobacco: Never  Vaping Use   Vaping Use: Never used  Substance and Sexual Activity   Alcohol use: No    Alcohol/week: 0.0 standard drinks   Drug use: No   Sexual activity: Never  Other Topics Concern   Not on file   Social History Narrative   Not on file   Social Determinants of Health   Financial Resource Strain: Not on file  Food Insecurity: Not on file  Transportation Needs: Not on file  Physical Activity: Not on file  Stress: Not on file  Social Connections: Not on file  Intimate Partner Violence: Not on file     Constitutional: Pt reports fatigue. Denies fever, malaise, headache or abrupt weight changes.  HEENT: Denies eye pain, eye redness, ear pain, ringing in the ears, wax buildup, runny nose, nasal congestion, bloody nose, or sore throat. Respiratory: Denies difficulty breathing, shortness of breath, cough or sputum production.   Cardiovascular: Denies chest pain, chest tightness, palpitations or swelling in the hands or feet.  Gastrointestinal: Denies abdominal pain, bloating, constipation, diarrhea or blood in the stool.  GU: Denies urgency, frequency, pain with urination, burning sensation, blood in urine, odor or discharge. Musculoskeletal: Pt reports bilateral knee and ankle pain. Denies decrease in range of motion, difficulty with gait, muscle pain or joint swelling.  Skin: Denies redness, rashes, lesions or ulcercations.  Neurological: Denies dizziness, difficulty with memory, difficulty with speech or problems with balance and coordination.  Psych: Pt has a history of depression. Denies anxiety, SI/HI.  No other specific complaints in a complete review of systems (except as listed in HPI above).  Objective:   Physical Exam BP 130/73 (BP Location: Right Arm, Patient Position: Sitting, Cuff Size: Normal)   Pulse 81   Temp 98.2 F (36.8 C) (Temporal)   Resp 17   Ht _0  (1.499 m)   Wt 186 lb 3.2 oz (84.5 kg)   SpO2 100%   Breastfeeding Yes   BMI 37.61 kg/m   Wt Readings from Last 3 Encounters:  09/25/20 208 lb 12.8 oz (94.7 kg)  10/25/19 167 lb (75.8 kg)  04/24/18 128 lb (58.1 kg)    General: Appears her stated age, obese, in NAD. Skin: Warm, dry and intact.  No rashes noted. HEENT: Head: normal shape and size; Eyes: sclera white and EOMs intact;  Neck:  Neck supple, trachea midline. No masses, lumps present. Thyromegaly noted. Cardiovascular: Normal rate and rhythm. S1,S2 noted.  No murmur, rubs or gallops noted. No JVD or BLE edema.  Pulmonary/Chest: Normal effort and positive vesicular breath sounds. No respiratory distress. No wheezes, rales or ronchi noted.  Abdomen: Soft and nontender. Normal bowel sounds. No distention or masses noted. Liver, spleen and kidneys non palpable. Musculoskeletal: Normal flexion and extension of the knees. Pain with palpation along the right medial joint line. Normal flexion, extension and rotation of the ankles. No joint swelling noted. Strength 5/5 BUE/BLE. No difficulty with gait.  Neurological: Alert and oriented. Cranial nerves II-XII grossly intact. Coordination normal.  Psychiatric: Mood and affect normal. Behavior is normal. Judgment and thought content normal.     BMET    Component Value Date/Time   NA 138 10/25/2019 1230   K 4.2 10/25/2019 1230   CL 104 10/25/2019 1230   CO2 28 10/25/2019 1230   GLUCOSE 91 10/25/2019 1230   BUN 15 10/25/2019 1230   CREATININE 0.86 10/25/2019 1230   CALCIUM 9.7 10/25/2019 1230    Lipid Panel     Component Value Date/Time   CHOL 140 10/25/2019 1230   TRIG 66.0 10/25/2019 1230   HDL 53.20 10/25/2019 1230   CHOLHDL 3 10/25/2019 1230   VLDL 13.2 10/25/2019 1230   LDLCALC 74 10/25/2019 1230    CBC    Component Value Date/Time   WBC 27.2 (H) 09/26/2020 0516   RBC 3.62 (L) 09/26/2020 0516   HGB 10.8 (L) 09/26/2020 0516   HCT 32.7 (L) 09/26/2020 0516   PLT 223 09/26/2020 0516   MCV 90.3 09/26/2020 0516   MCH 29.8 09/26/2020 0516   MCHC 33.0 09/26/2020 0516   RDW 13.3 09/26/2020 0516   LYMPHSABS 1.8 06/02/2016 1157   MONOABS 0.8 06/02/2016 1157   EOSABS 0.1 06/02/2016 1157   BASOSABS 0.0 06/02/2016 1157    Hgb A1C Lab Results  Component Value Date    HGBA1C 5.3 10/25/2019            Assessment & Plan:   Preventative Health Maintenance:  Encouraged her to get a flu shot in the fall Tetanus UTD Encouraged her to get a covid booster Pap smear UTD, will request copy Encouraged her to consume a balanced diet and exercise regimen Advised her to see a dentist annually Will check CBC, CMET, TSH, Lipid, A1C and Hep C today  Fatigue, Multiple Joint Pain, Family History of Lupus:  Will check ANA, ESR, RF, CRP, C3, C4, antiphospholipid antibodies, anti DS DNA  Acute Right Knee Pain:  Xray right knee today Encouraged rest, ice and elevation  RTC in 1 year, sooner if needed Webb Silversmith, NP This visit occurred during the SARS-CoV-2 public health emergency.  Safety protocols were in place, including screening questions prior to the visit, additional usage of staff PPE, and extensive cleaning of exam room while observing appropriate contact time as indicated for disinfecting solutions.

## 2020-11-27 NOTE — Patient Instructions (Signed)
Health Maintenance, Female Adopting a healthy lifestyle and getting preventive care are important in promoting health and wellness. Ask your health care provider about: The right schedule for you to have regular tests and exams. Things you can do on your own to prevent diseases and keep yourself healthy. What should I know about diet, weight, and exercise? Eat a healthy diet  Eat a diet that includes plenty of vegetables, fruits, low-fat dairy products, and lean protein. Do not eat a lot of foods that are high in solid fats, added sugars, or sodium.  Maintain a healthy weight Body mass index (BMI) is used to identify weight problems. It estimates body fat based on height and weight. Your health care provider can help determineyour BMI and help you achieve or maintain a healthy weight. Get regular exercise Get regular exercise. This is one of the most important things you can do for your health. Most adults should: Exercise for at least 150 minutes each week. The exercise should increase your heart rate and make you sweat (moderate-intensity exercise). Do strengthening exercises at least twice a week. This is in addition to the moderate-intensity exercise. Spend less time sitting. Even light physical activity can be beneficial. Watch cholesterol and blood lipids Have your blood tested for lipids and cholesterol at 24 years of age, then havethis test every 5 years. Have your cholesterol levels checked more often if: Your lipid or cholesterol levels are high. You are older than 24 years of age. You are at high risk for heart disease. What should I know about cancer screening? Depending on your health history and family history, you may need to have cancer screening at various ages. This may include screening for: Breast cancer. Cervical cancer. Colorectal cancer. Skin cancer. Lung cancer. What should I know about heart disease, diabetes, and high blood pressure? Blood pressure and heart  disease High blood pressure causes heart disease and increases the risk of stroke. This is more likely to develop in people who have high blood pressure readings, are of African descent, or are overweight. Have your blood pressure checked: Every 3-5 years if you are 18-39 years of age. Every year if you are 40 years old or older. Diabetes Have regular diabetes screenings. This checks your fasting blood sugar level. Have the screening done: Once every three years after age 40 if you are at a normal weight and have a low risk for diabetes. More often and at a younger age if you are overweight or have a high risk for diabetes. What should I know about preventing infection? Hepatitis B If you have a higher risk for hepatitis B, you should be screened for this virus. Talk with your health care provider to find out if you are at risk forhepatitis B infection. Hepatitis C Testing is recommended for: Everyone born from 1945 through 1965. Anyone with known risk factors for hepatitis C. Sexually transmitted infections (STIs) Get screened for STIs, including gonorrhea and chlamydia, if: You are sexually active and are younger than 24 years of age. You are older than 24 years of age and your health care provider tells you that you are at risk for this type of infection. Your sexual activity has changed since you were last screened, and you are at increased risk for chlamydia or gonorrhea. Ask your health care provider if you are at risk. Ask your health care provider about whether you are at high risk for HIV. Your health care provider may recommend a prescription medicine to help   prevent HIV infection. If you choose to take medicine to prevent HIV, you should first get tested for HIV. You should then be tested every 3 months for as long as you are taking the medicine. Pregnancy If you are about to stop having your period (premenopausal) and you may become pregnant, seek counseling before you get  pregnant. Take 400 to 800 micrograms (mcg) of folic acid every day if you become pregnant. Ask for birth control (contraception) if you want to prevent pregnancy. Osteoporosis and menopause Osteoporosis is a disease in which the bones lose minerals and strength with aging. This can result in bone fractures. If you are 65 years old or older, or if you are at risk for osteoporosis and fractures, ask your health care provider if you should: Be screened for bone loss. Take a calcium or vitamin D supplement to lower your risk of fractures. Be given hormone replacement therapy (HRT) to treat symptoms of menopause. Follow these instructions at home: Lifestyle Do not use any products that contain nicotine or tobacco, such as cigarettes, e-cigarettes, and chewing tobacco. If you need help quitting, ask your health care provider. Do not use street drugs. Do not share needles. Ask your health care provider for help if you need support or information about quitting drugs. Alcohol use Do not drink alcohol if: Your health care provider tells you not to drink. You are pregnant, may be pregnant, or are planning to become pregnant. If you drink alcohol: Limit how much you use to 0-1 drink a day. Limit intake if you are breastfeeding. Be aware of how much alcohol is in your drink. In the [place] one drink equals one 12 oz bottle of beer (355 mL), one 5 oz glass of wine (148 mL), or one 1 oz glass of hard liquor (44 mL). General instructions Schedule regular health, dental, and eye exams. Stay current with your vaccines. Tell your health care provider if: You often feel depressed. You have ever been abused or do not feel safe at home. Summary Adopting a healthy lifestyle and getting preventive care are important in promoting health and wellness. Follow your health care provider's instructions about healthy diet, exercising, and getting tested or screened for diseases. Follow your health care provider's  instructions on monitoring your cholesterol and blood pressure. This information is not intended to replace advice given to you by your health care provider. Make sure you discuss any questions you have with your healthcare provider. Document Revised: 04/05/2018 Document Reviewed: 04/05/2018 Elsevier Patient Education  2022 Elsevier Inc.  

## 2020-11-27 NOTE — Assessment & Plan Note (Signed)
IBC panel today Continue oral iron

## 2020-11-27 NOTE — Assessment & Plan Note (Signed)
Encouraged diet and exercise for weight loss ?

## 2020-11-27 NOTE — Assessment & Plan Note (Signed)
Stable off meds Support offered 

## 2020-11-28 ENCOUNTER — Encounter (HOSPITAL_COMMUNITY): Payer: Self-pay | Admitting: Obstetrics and Gynecology

## 2020-12-03 LAB — C3 AND C4
C3 Complement: 213 mg/dL — ABNORMAL HIGH (ref 83–193)
C4 Complement: 43 mg/dL (ref 15–57)

## 2020-12-03 LAB — LIPID PANEL
Cholesterol: 159 mg/dL (ref <200)
HDL: 57 mg/dL (ref >=50)
LDL Cholesterol (Calc): 81 mg/dL (calc)
Non-HDL Cholesterol (Calc): 102 mg/dL (calc) (ref <130)
Total CHOL/HDL Ratio: 2.8 (calc) (ref <5.0)
Triglycerides: 117 mg/dL (ref <150)

## 2020-12-03 LAB — IRON,TIBC AND FERRITIN PANEL
%SAT: 35 % (calc) (ref 16–45)
Ferritin: 156 ng/mL — ABNORMAL HIGH (ref 16–154)
Iron: 115 ug/dL (ref 40–190)
TIBC: 333 mcg/dL (calc) (ref 250–450)

## 2020-12-03 LAB — HEMOGLOBIN A1C
Hgb A1c MFr Bld: 5.5 % of total Hgb (ref <5.7)
Mean Plasma Glucose: 111 mg/dL
eAG (mmol/L): 6.2 mmol/L

## 2020-12-03 LAB — CBC
HCT: 40.7 % (ref 35.0–45.0)
Hemoglobin: 13.6 g/dL (ref 11.7–15.5)
MCH: 29.1 pg (ref 27.0–33.0)
MCHC: 33.4 g/dL (ref 32.0–36.0)
MCV: 87.2 fL (ref 80.0–100.0)
MPV: 10.4 fL (ref 7.5–12.5)
Platelets: 280 10*3/uL (ref 140–400)
RBC: 4.67 10*6/uL (ref 3.80–5.10)
RDW: 12.8 % (ref 11.0–15.0)
WBC: 9.5 10*3/uL (ref 3.8–10.8)

## 2020-12-03 LAB — COMPLETE METABOLIC PANEL WITH GFR
AG Ratio: 2 (calc) (ref 1.0–2.5)
ALT: 21 U/L (ref 6–29)
AST: 15 U/L (ref 10–30)
Albumin: 5 g/dL (ref 3.6–5.1)
Alkaline phosphatase (APISO): 108 U/L (ref 31–125)
BUN: 16 mg/dL (ref 7–25)
CO2: 24 mmol/L (ref 20–32)
Calcium: 10 mg/dL (ref 8.6–10.2)
Chloride: 105 mmol/L (ref 98–110)
Creat: 0.93 mg/dL (ref 0.50–0.96)
Globulin: 2.5 g/dL (calc) (ref 1.9–3.7)
Glucose, Bld: 76 mg/dL (ref 65–139)
Potassium: 4 mmol/L (ref 3.5–5.3)
Sodium: 141 mmol/L (ref 135–146)
Total Bilirubin: 0.4 mg/dL (ref 0.2–1.2)
Total Protein: 7.5 g/dL (ref 6.1–8.1)
eGFR: 89 mL/min/{1.73_m2} (ref >=60)

## 2020-12-03 LAB — HEPATITIS C ANTIBODY
Hepatitis C Ab: NONREACTIVE
SIGNAL TO CUT-OFF: 0.01 (ref <1.00)

## 2020-12-03 LAB — ANTIPHOSPHOLIPID SYNDROME DIAGNOSTIC PANEL
Anticardiolipin IgA: <2 APL-U/mL (ref <20.0)
Anticardiolipin IgG: <2 GPL-U/mL (ref <20.0)
Anticardiolipin IgM: <2 MPL-U/mL (ref <20.0)
Beta-2 Glyco 1 IgA: <2 U/mL (ref <20.0)
Beta-2 Glyco 1 IgM: <2 U/mL (ref <20.0)
Beta-2 Glyco I IgG: <2 U/mL (ref <20.0)
PTT-LA Screen: 36 s (ref <=40)
dRVVT: 46 s — ABNORMAL HIGH (ref <=45)

## 2020-12-03 LAB — C-REACTIVE PROTEIN: CRP: 5.7 mg/L (ref <8.0)

## 2020-12-03 LAB — SEDIMENTATION RATE: Sed Rate: 2 mm/h (ref 0–20)

## 2020-12-03 LAB — RHEUMATOID FACTOR: Rheumatoid fact SerPl-aCnc: <14 IU/mL (ref <14)

## 2020-12-03 LAB — TSH: TSH: 2.04 mIU/L

## 2020-12-03 LAB — RFLX DRVVT CONFRIM: DRVVT CONFIRM: NEGATIVE

## 2020-12-03 LAB — ANTI-DNA ANTIBODY, DOUBLE-STRANDED: ds DNA Ab: 1 IU/mL

## 2020-12-03 LAB — ANA: Anti Nuclear Antibody (ANA): NEGATIVE

## 2020-12-25 DIAGNOSIS — Z419 Encounter for procedure for purposes other than remedying health state, unspecified: Secondary | ICD-10-CM | POA: Diagnosis not present

## 2021-01-24 DIAGNOSIS — Z419 Encounter for procedure for purposes other than remedying health state, unspecified: Secondary | ICD-10-CM | POA: Diagnosis not present

## 2021-02-24 DIAGNOSIS — Z419 Encounter for procedure for purposes other than remedying health state, unspecified: Secondary | ICD-10-CM | POA: Diagnosis not present

## 2021-03-26 DIAGNOSIS — Z419 Encounter for procedure for purposes other than remedying health state, unspecified: Secondary | ICD-10-CM | POA: Diagnosis not present

## 2021-04-26 DIAGNOSIS — Z419 Encounter for procedure for purposes other than remedying health state, unspecified: Secondary | ICD-10-CM | POA: Diagnosis not present

## 2021-05-10 ENCOUNTER — Telehealth: Payer: Managed Care, Other (non HMO) | Admitting: Emergency Medicine

## 2021-05-10 DIAGNOSIS — R300 Dysuria: Secondary | ICD-10-CM

## 2021-05-10 DIAGNOSIS — R350 Frequency of micturition: Secondary | ICD-10-CM

## 2021-05-10 MED ORDER — NITROFURANTOIN MONOHYD MACRO 100 MG PO CAPS: 100.0000 mg | ORAL_CAPSULE | Freq: Two times a day (BID) | ORAL | 0 refills | Status: AC

## 2021-05-10 NOTE — Progress Notes (Signed)

## 2021-05-10 NOTE — Progress Notes (Signed)
I have spent 5 minutes in review of e-visit questionnaire, review and updating patient chart, medical decision making and response to patient.   Olen Eaves, PA-C    

## 2021-05-27 DIAGNOSIS — Z419 Encounter for procedure for purposes other than remedying health state, unspecified: Secondary | ICD-10-CM | POA: Diagnosis not present

## 2021-06-24 DIAGNOSIS — Z419 Encounter for procedure for purposes other than remedying health state, unspecified: Secondary | ICD-10-CM | POA: Diagnosis not present

## 2021-07-25 DIAGNOSIS — Z419 Encounter for procedure for purposes other than remedying health state, unspecified: Secondary | ICD-10-CM | POA: Diagnosis not present

## 2021-08-24 DIAGNOSIS — Z419 Encounter for procedure for purposes other than remedying health state, unspecified: Secondary | ICD-10-CM | POA: Diagnosis not present

## 2021-09-24 DIAGNOSIS — Z419 Encounter for procedure for purposes other than remedying health state, unspecified: Secondary | ICD-10-CM | POA: Diagnosis not present

## 2021-12-01 ENCOUNTER — Ambulatory Visit (INDEPENDENT_AMBULATORY_CARE_PROVIDER_SITE_OTHER): Payer: 59 | Admitting: Internal Medicine

## 2021-12-01 ENCOUNTER — Encounter: Payer: Self-pay | Admitting: Internal Medicine

## 2021-12-01 VITALS — BP 128/65 | HR 87 | Temp 98.4°F | Ht 59.0 in | Wt 168.0 lb

## 2021-12-01 DIAGNOSIS — F329 Major depressive disorder, single episode, unspecified: Secondary | ICD-10-CM

## 2021-12-01 DIAGNOSIS — Z6833 Body mass index (BMI) 33.0-33.9, adult: Secondary | ICD-10-CM | POA: Diagnosis not present

## 2021-12-01 DIAGNOSIS — Z111 Encounter for screening for respiratory tuberculosis: Secondary | ICD-10-CM | POA: Diagnosis not present

## 2021-12-01 DIAGNOSIS — Z0001 Encounter for general adult medical examination with abnormal findings: Secondary | ICD-10-CM | POA: Diagnosis not present

## 2021-12-01 DIAGNOSIS — Z6836 Body mass index (BMI) 36.0-36.9, adult: Secondary | ICD-10-CM | POA: Insufficient documentation

## 2021-12-01 DIAGNOSIS — E6609 Other obesity due to excess calories: Secondary | ICD-10-CM | POA: Insufficient documentation

## 2021-12-01 NOTE — Patient Instructions (Signed)

## 2021-12-01 NOTE — Progress Notes (Signed)
Subjective:    Patient ID: Joanna Walsh, female    DOB: [birth year], 26 y.o.   MRN: [id number]  HPI  Patient presents to clinic today for her annual exam.  Flu: 01/2021 Tetanus: 06/2020 COVID: Pfizer x 2 Pap smear: 01/2020 Dentist: biannually  Diet: She does eat meat. She consumes fruits and veggies. She tries to avoid fried foods. She drinks mostly water. Exercise: None  Review of Systems     Past Medical History:  Diagnosis Date   Chicken pox    Depression    Pregnancy induced hypertension     Current Outpatient Medications  Medication Sig Dispense Refill   etonogestrel (NEXPLANON) 68 MG IMPL implant Nexplanon 68 mg subdermal implant  PROVIDED BY [facility]ARE [facility]     No current facility-administered medications for this visit.    No Known Allergies  Family History  Problem Relation Age of Onset   Prostate cancer Maternal Grandfather    Heart disease Maternal Grandfather     Social History   Socioeconomic History   Marital status: Married    Spouse name: Not on file   Number of children: Not on file   Years of education: Not on file   Highest education level: Not on file  Occupational History   Not on file  Tobacco Use   Smoking status: Never   Smokeless tobacco: Never  Vaping Use   Vaping Use: Never used  Substance and Sexual Activity   Alcohol use: No    Alcohol/week: 0.0 standard drinks of alcohol   Drug use: No   Sexual activity: Never  Other Topics Concern   Not on file  Social History Narrative   Not on file   Social Determinants of Health   Financial Resource Strain: Not on file  Food Insecurity: Not on file  Transportation Needs: Not on file  Physical Activity: Not on file  Stress: Not on file  Social Connections: Not on file  Intimate Partner Violence: Not on file     Constitutional: Denies fever, malaise, fatigue, headache or abrupt weight changes.  HEENT: Denies eye pain, eye redness, ear pain, ringing in the ears, wax buildup,  runny nose, nasal congestion, bloody nose, or sore throat. Respiratory: Denies difficulty breathing, shortness of breath, cough or sputum production.   Cardiovascular: Denies chest pain, chest tightness, palpitations or swelling in the hands or feet.  Gastrointestinal: Denies abdominal pain, bloating, constipation, diarrhea or blood in the stool.  GU: Pt reports irregular periods. Denies urgency, frequency, pain with urination, burning sensation, blood in urine, odor or discharge. Musculoskeletal: Denies decrease in range of motion, difficulty with gait, muscle pain or joint pain and swelling.  Skin: Denies redness, rashes, lesions or ulcercations.  Neurological: Denies dizziness, difficulty with memory, difficulty with speech or problems with balance and coordination.  Psych: Patient has a history of depression.  Denies anxiety, SI/HI.  No other specific complaints in a complete review of systems (except as listed in HPI above).  Objective:   Physical Exam  BP 128/65 (BP Location: Left Arm, Patient Position: Sitting, Cuff Size: Normal)   Pulse 87   Temp 98.4 F (36.9 C) (Oral)   Ht $R'4\' 11"'cY$  (1.499 m)   Wt 168 lb (76.2 kg)   SpO2 98%   BMI 33.93 kg/m   Wt Readings from Last 3 Encounters:  11/27/20 186 lb 3.2 oz (84.5 kg)  09/25/20 208 lb 12.8 oz (94.7 kg)  10/25/19 167 lb (75.8 kg)    General: Appears her  stated age, obese, in NAD. Skin: Warm, dry and intact.  Excessive hair noted of face and forearms. HEENT: Head: normal shape and size; Eyes: sclera white, no icterus, conjunctiva pink, PERRLA and EOMs intact;  Neck:  Neck supple, trachea midline. No masses, lumps or thyromegaly present.  Cardiovascular: Normal rate and rhythm. S1,S2 noted.  Murmur noted. No JVD or BLE edema Pulmonary/Chest: Normal effort and positive vesicular breath sounds. No respiratory distress. No wheezes, rales or ronchi noted.  Abdomen: Soft and nontender. Normal bowel sounds. No distention or masses  noted. Musculoskeletal: Strength 5/5 BUE/BLE.  No difficulty with gait.  Neurological: Alert and oriented. Cranial nerves II-XII grossly intact. Coordination normal.  Psychiatric: Mood and affect normal. Behavior is normal. Judgment and thought content normal.    BMET    Component Value Date/Time   NA 141 11/27/2020 1334   K 4.0 11/27/2020 1334   CL 105 11/27/2020 1334   CO2 24 11/27/2020 1334   GLUCOSE 76 11/27/2020 1334   BUN 16 11/27/2020 1334   CREATININE 0.93 11/27/2020 1334   CALCIUM 10.0 11/27/2020 1334    Lipid Panel     Component Value Date/Time   CHOL 159 11/27/2020 1334   TRIG 117 11/27/2020 1334   HDL 57 11/27/2020 1334   CHOLHDL 2.8 11/27/2020 1334   VLDL 13.2 10/25/2019 1230   LDLCALC 81 11/27/2020 1334    CBC    Component Value Date/Time   WBC 9.5 11/27/2020 1334   RBC 4.67 11/27/2020 1334   HGB 13.6 11/27/2020 1334   HCT 40.7 11/27/2020 1334   PLT 280 11/27/2020 1334   MCV 87.2 11/27/2020 1334   MCH 29.1 11/27/2020 1334   MCHC 33.4 11/27/2020 1334   RDW 12.8 11/27/2020 1334   LYMPHSABS 1.8 06/02/2016 1157   MONOABS 0.8 06/02/2016 1157   EOSABS 0.1 06/02/2016 1157   BASOSABS 0.0 06/02/2016 1157    Hgb A1C Lab Results  Component Value Date   HGBA1C 5.5 11/27/2020           Assessment & Plan:   Preventative Health Maintenance:  Encouraged her to get a flu shot in the fall Tetanus UTD Encouraged her to get her COVID booster Pap smear UTD Encouraged her to consume a balanced diet and exercise regimen Advised her to see a dentist annually Will check CBC, c-Met, lipid, A1c today  RTC 1 year, sooner if needed Webb Silversmith, NP

## 2021-12-01 NOTE — Assessment & Plan Note (Signed)
Encourage diet and exercise for weight loss 

## 2021-12-01 NOTE — Assessment & Plan Note (Signed)
Currently not an issue 

## 2021-12-02 LAB — CBC
HCT: 42.4 % (ref 35.0–45.0)
Hemoglobin: 14.4 g/dL (ref 11.7–15.5)
MCH: 29.5 pg (ref 27.0–33.0)
MCHC: 34 g/dL (ref 32.0–36.0)
MCV: 86.9 fL (ref 80.0–100.0)
MPV: 12.3 fL (ref 7.5–12.5)
Platelets: 253 10*3/uL (ref 140–400)
RBC: 4.88 10*6/uL (ref 3.80–5.10)
RDW: 11.9 % (ref 11.0–15.0)
WBC: 10.7 10*3/uL (ref 3.8–10.8)

## 2021-12-02 LAB — COMPLETE METABOLIC PANEL WITH GFR
AG Ratio: 2.4 (calc) (ref 1.0–2.5)
ALT: 29 U/L (ref 6–29)
AST: 18 U/L (ref 10–30)
Albumin: 5 g/dL (ref 3.6–5.1)
Alkaline phosphatase (APISO): 67 U/L (ref 31–125)
BUN/Creatinine Ratio: 15 (calc) (ref 6–22)
BUN: 15 mg/dL (ref 7–25)
CO2: 25 mmol/L (ref 20–32)
Calcium: 10 mg/dL (ref 8.6–10.2)
Chloride: 105 mmol/L (ref 98–110)
Creat: 1 mg/dL — ABNORMAL HIGH (ref 0.50–0.96)
Globulin: 2.1 g/dL (calc) (ref 1.9–3.7)
Glucose, Bld: 101 mg/dL (ref 65–139)
Potassium: 4.1 mmol/L (ref 3.5–5.3)
Sodium: 138 mmol/L (ref 135–146)
Total Bilirubin: 0.5 mg/dL (ref 0.2–1.2)
Total Protein: 7.1 g/dL (ref 6.1–8.1)
eGFR: 81 mL/min/{1.73_m2} (ref >=60)

## 2021-12-02 LAB — LIPID PANEL
Cholesterol: 128 mg/dL (ref <200)
HDL: 45 mg/dL — ABNORMAL LOW (ref >=50)
LDL Cholesterol (Calc): 63 mg/dL (calc)
Non-HDL Cholesterol (Calc): 83 mg/dL (calc) (ref <130)
Total CHOL/HDL Ratio: 2.8 (calc) (ref <5.0)
Triglycerides: 115 mg/dL (ref <150)

## 2021-12-02 LAB — HEMOGLOBIN A1C
Hgb A1c MFr Bld: 5.2 % of total Hgb (ref <5.7)
Mean Plasma Glucose: 103 mg/dL
eAG (mmol/L): 5.7 mmol/L

## 2021-12-04 LAB — TB SKIN TEST
Induration: 0 mm
TB Skin Test: NEGATIVE

## 2021-12-04 NOTE — Addendum Note (Signed)
Addended by: Kavin Leech E on: 12/04/2021 08:27 AM   Modules accepted: Orders

## 2022-06-18 ENCOUNTER — Encounter: Payer: Self-pay | Admitting: Internal Medicine

## 2022-07-06 ENCOUNTER — Telehealth: Payer: Medicaid Other | Admitting: Physician Assistant

## 2022-07-06 DIAGNOSIS — J069 Acute upper respiratory infection, unspecified: Secondary | ICD-10-CM | POA: Diagnosis not present

## 2022-07-06 MED ORDER — BENZONATATE 100 MG PO CAPS: 100.0000 mg | ORAL_CAPSULE | Freq: Three times a day (TID) | ORAL | 0 refills | Status: DC | PRN

## 2022-07-06 MED ORDER — FLUTICASONE PROPIONATE 50 MCG/ACT NA SUSP: 2.0000 | Freq: Every day | NASAL | 6 refills | Status: DC

## 2022-07-06 NOTE — Patient Instructions (Signed)
  Geanie Cooley, thank you for joining [facility], PA-C for today's virtual visit.  While this provider is not your primary care provider (PCP), if your PCP is located in our provider database this encounter information will be shared with them immediately following your visit.   [facility] account gives you access to today's visit and all your visits, tests, and labs performed at [facility] " click here if you don't have a [facility] account or go to [email or web address]  Consent: (Patient) Kellsey Sansone provided verbal consent for this virtual visit at the beginning of the encounter.  Current Medications:  Current Outpatient Medications:    benzonatate (TESSALON) 100 MG capsule, Take 1 capsule (100 mg total) by mouth 3 (three) times daily as needed., Disp: 20 capsule, Rfl: 0   fluticasone (FLONASE) 50 MCG/ACT nasal spray, Place 2 sprays into both nostrils daily., Disp: 16 g, Rfl: 6   etonogestrel (NEXPLANON) 68 MG IMPL implant, Nexplanon 68 mg subdermal implant  PROVIDED BY [facility]ARE [facility]ENTER, Disp: , Rfl:    norgestimate-ethinyl estradiol (ORTHO-CYCLEN) 0.25-35 MG-MCG tablet, Take 1 tablet by mouth daily., Disp: , Rfl:    Medications ordered in this encounter:  Meds ordered this encounter  Medications   benzonatate (TESSALON) 100 MG capsule    Sig: Take 1 capsule (100 mg total) by mouth 3 (three) times daily as needed.    Dispense:  20 capsule    Refill:  0    Order Specific Question:   Supervising Provider    Answer:   Chase Picket [[id number]]   fluticasone (FLONASE) 50 MCG/ACT nasal spray    Sig: Place 2 sprays into both nostrils daily.    Dispense:  16 g    Refill:  6    Order Specific Question:   Supervising Provider    Answer:   Chase Picket [id number]     *If you need refills on other medications prior to your next appointment, please contact your pharmacy*  Follow-Up: Call back or seek an in-person evaluation if the symptoms  worsen or if the condition fails to improve as anticipated.  [place] [phone]  Other Instructions Use Flonase and take Mucinex.  Drink plenty of fluids.  Can take tessalon as needed for cough.  Over the counter Delsym is helpful for cough as well.  Take Ibuprofen or Tylenol as needed for pain.     If you have been instructed to have an in-person evaluation today at a local Urgent Care facility, please use the link below. It will take you to a list of all of our available [facility], including address, phone number and hours of operation. Please do not delay care.  [facility]  If you or a family member do not have a primary care provider, use the link below to schedule a visit and establish care. When you choose a [facility] primary care physician or advanced practice provider, you gain a long-term partner in health. Find a Primary Care Provider  Learn more about [facility]'s in-office and virtual care options: [facility]

## 2022-07-06 NOTE — Progress Notes (Signed)
Virtual Visit Consent   Joanna Walsh, you are scheduled for a virtual visit with a [place] provider today. Just as with appointments in the office, your consent must be obtained to participate. Your consent will be active for this visit and any virtual visit you may have with one of our providers in the next 365 days. If you have a MyChart account, a copy of this consent can be sent to you electronically.  As this is a virtual visit, video technology does not allow for your provider to perform a traditional examination. This may limit your provider's ability to fully assess your condition. If your provider identifies any concerns that need to be evaluated in person or the need to arrange testing (such as labs, EKG, etc.), we will make arrangements to do so. Although advances in technology are sophisticated, we cannot ensure that it will always work on either your end or our end. If the connection with a video visit is poor, the visit may have to be switched to a telephone visit. With either a video or telephone visit, we are not always able to ensure that we have a secure connection.  By engaging in this virtual visit, you consent to the provision of healthcare and authorize for your insurance to be billed (if applicable) for the services provided during this visit. Depending on your insurance coverage, you may receive a charge related to this service.  I need to obtain your verbal consent now. Are you willing to proceed with your visit today? Joanna Walsh has provided verbal consent on 07/06/2022 for a virtual visit (video or telephone). Joanna Arena Ward, Joanna Walsh  Date: 07/06/2022 4:36 PM  Virtual Visit via Video Note   I, Joanna Walsh, connected with  Joanna Walsh  ([id number], 1995/06/13) on 07/06/22 at  4:30 PM EDT by a video-enabled telemedicine application and verified that I am speaking with the correct person using two identifiers.  Location: Patient: Virtual Visit Location Patient:  Home Provider: Virtual Visit Location Provider: Home Office   I discussed the limitations of evaluation and management by telemedicine and the availability of in person appointments. The patient expressed understanding and agreed to proceed.    History of Present Illness: Joanna Walsh is a 27 y.o. who identifies as a female who was assigned female at birth, and is being seen today for congestion, sinus pressure, productive cough that started about 4-5 days ago.  She denies fever, chills, body aches, wheezing, shortness of breath. She is currently taking cold and flu otc meds with temporary relief.  Denies known sick contacts.   HPI: HPI  Problems:  Patient Active Problem List   Diagnosis Date Noted   Class 1 obesity due to excess calories with body mass index (BMI) of 33.0 to 33.9 in adult 12/01/2021   Depression 06/23/2015    Allergies: No Known Allergies Medications:  Current Outpatient Medications:    benzonatate (TESSALON) 100 MG capsule, Take 1 capsule (100 mg total) by mouth 3 (three) times daily as needed., Disp: 20 capsule, Rfl: 0   fluticasone (FLONASE) 50 MCG/ACT nasal spray, Place 2 sprays into both nostrils daily., Disp: 16 g, Rfl: 6   etonogestrel (NEXPLANON) 68 MG IMPL implant, Nexplanon 68 mg subdermal implant  PROVIDED BY [facility], Disp: , Rfl:    norgestimate-ethinyl estradiol (ORTHO-CYCLEN) 0.25-35 MG-MCG tablet, Take 1 tablet by mouth daily., Disp: , Rfl:   Observations/Objective: Patient is well-developed, well-nourished in no acute distress.  Resting comfortably at home.  Head  is normocephalic, atraumatic.  No labored breathing.  Speech is clear and coherent with logical content.  Patient is alert and oriented at baseline.    Assessment and Plan: 1. Upper respiratory tract infection, unspecified type - benzonatate (TESSALON) 100 MG capsule; Take 1 capsule (100 mg total) by mouth 3 (three) times daily as needed.  Dispense: 20 capsule; Refill: 0 - fluticasone  (FLONASE) 50 MCG/ACT nasal spray; Place 2 sprays into both nostrils daily.  Dispense: 16 g; Refill: 6  Supportive care discussed.   Follow Up Instructions: I discussed the assessment and treatment plan with the patient. The patient was provided an opportunity to ask questions and all were answered. The patient agreed with the plan and demonstrated an understanding of the instructions.  A copy of instructions were sent to the patient via MyChart unless otherwise noted below.     The patient was advised to call back or seek an in-person evaluation if the symptoms worsen or if the condition fails to improve as anticipated.  Time:  I spent 10 minutes with the patient via telehealth technology discussing the above problems/concerns.    Joanna Arena Ward, Joanna Walsh

## 2022-12-23 ENCOUNTER — Encounter: Payer: Medicaid Other | Admitting: Internal Medicine

## 2023-02-04 ENCOUNTER — Ambulatory Visit (INDEPENDENT_AMBULATORY_CARE_PROVIDER_SITE_OTHER): Payer: BLUE CROSS/BLUE SHIELD | Admitting: Internal Medicine

## 2023-02-04 ENCOUNTER — Encounter: Payer: Self-pay | Admitting: Internal Medicine

## 2023-02-04 VITALS — BP 126/86 | HR 95 | Temp 95.5°F | Wt 183.0 lb

## 2023-02-04 DIAGNOSIS — R051 Acute cough: Secondary | ICD-10-CM

## 2023-02-04 DIAGNOSIS — J302 Other seasonal allergic rhinitis: Secondary | ICD-10-CM

## 2023-02-04 DIAGNOSIS — R509 Fever, unspecified: Secondary | ICD-10-CM

## 2023-02-04 DIAGNOSIS — J029 Acute pharyngitis, unspecified: Secondary | ICD-10-CM

## 2023-02-04 DIAGNOSIS — R058 Other specified cough: Secondary | ICD-10-CM

## 2023-02-04 DIAGNOSIS — J069 Acute upper respiratory infection, unspecified: Secondary | ICD-10-CM | POA: Diagnosis not present

## 2023-02-04 LAB — POCT RAPID STREP A (OFFICE): Rapid Strep A Screen: NEGATIVE

## 2023-02-04 LAB — POCT INFLUENZA A/B
Influenza A, POC: NEGATIVE
Influenza B, POC: NEGATIVE

## 2023-02-04 LAB — POC COVID19 BINAXNOW: SARS Coronavirus 2 Ag: NEGATIVE

## 2023-02-04 MED ORDER — BENZONATATE 100 MG PO CAPS: 100.0000 mg | ORAL_CAPSULE | Freq: Three times a day (TID) | ORAL | 0 refills | Status: DC | PRN

## 2023-02-04 MED ORDER — PREDNISONE 10 MG PO TABS: ORAL_TABLET | ORAL | 0 refills | Status: DC

## 2023-02-04 NOTE — Progress Notes (Signed)
Subjective:    Patient ID: Joanna Walsh, female    DOB: 1996-01-02, 27 y.o.   MRN: [id number]  HPI  Discussed the use of AI scribe software for clinical note transcription with the patient, who gave verbal consent to proceed.  The patient, with a history of recurrent tonsillitis and allergies, presented with a fever that started on Sunday. The fever was accompanied by a productive cough, producing yellowish mucus, and a sore throat, which the patient attributed to the coughing. The patient also reported increased effort in breathing, chills, and body aches. They denied experiencing any shortness of breath, nausea, vomiting, or diarrhea.  The patient's son had recently been ill with a viral infection, and the patient had been taking over-the-counter cough medication and Tylenol to manage their symptoms. They had a history of pneumonia in childhood but denied having asthma. The patient also reported a sensation of post-nasal drip and a constant feeling of something at the top of their throat.  Review of Systems   Past Medical History:  Diagnosis Date   Chicken pox    Depression    Pregnancy induced hypertension     Current Outpatient Medications  Medication Sig Dispense Refill   benzonatate (TESSALON) 100 MG capsule Take 1 capsule (100 mg total) by mouth 3 (three) times daily as needed. 20 capsule 0   etonogestrel (NEXPLANON) 68 MG IMPL implant Nexplanon 68 mg subdermal implant  PROVIDED BY [facility]     fluticasone (FLONASE) 50 MCG/ACT nasal spray Place 2 sprays into both nostrils daily. 16 g 6   norgestimate-ethinyl estradiol (ORTHO-CYCLEN) 0.25-35 MG-MCG tablet Take 1 tablet by mouth daily.     No current facility-administered medications for this visit.    No Known Allergies  Family History  Problem Relation Age of Onset   Lupus Sister    Prostate cancer Maternal Grandfather    Heart disease Maternal Grandfather     Social History   Socioeconomic History   Marital  status: Married    Spouse name: Not on file   Number of children: Not on file   Years of education: Not on file   Highest education level: Not on file  Occupational History   Not on file  Tobacco Use   Smoking status: Never   Smokeless tobacco: Never  Vaping Use   Vaping status: Never Used  Substance and Sexual Activity   Alcohol use: No    Alcohol/week: 0.0 standard drinks of alcohol   Drug use: No   Sexual activity: Never  Other Topics Concern   Not on file  Social History Narrative   Not on file   Social Determinants of Health   Financial Resource Strain: Not on file  Food Insecurity: Not on file  Transportation Needs: Not on file  Physical Activity: Not on file  Stress: Not on file  Social Connections: Not on file  Intimate Partner Violence: Not on file     Constitutional: Patient reports fever.  Denies malaise, fatigue, headache or abrupt weight changes.  HEENT: Pt reports sore throat. Denies eye pain, eye redness, ear pain, ringing in the ears, wax buildup, runny nose, nasal congestion, bloody nose. Respiratory: Patient reports cough.  Denies difficulty breathing, shortness of breath, or sputum production.   Cardiovascular: Denies chest pain, chest tightness, palpitations or swelling in the hands or feet.  Gastrointestinal: Denies abdominal pain, bloating, constipation, diarrhea or blood in the stool.   No other specific complaints in a complete review of systems (except as  listed in HPI above).     Objective:   Physical Exam  BP 126/86 (BP Location: Right Arm, Patient Position: Sitting, Cuff Size: Normal)   Pulse 95   Temp (!) 95.5 F (35.3 C) (Temporal)   Wt 183 lb (83 kg)   SpO2 97%   BMI 36.96 kg/m   Wt Readings from Last 3 Encounters:  12/01/21 168 lb (76.2 kg)  11/27/20 186 lb 3.2 oz (84.5 kg)  09/25/20 208 lb 12.8 oz (94.7 kg)    General: Appears her stated age, obese, in NAD. Skin: Warm, dry and intact. No rashes noted. HEENT: Head: normal  shape and size; Eyes: sclera white, no icterus, conjunctiva pink, PERRLA and EOMs intact; Throat/Mouth: Teeth present, mucosa pink and moist, tonsils 2+ with erythema but no exudate, lesions or ulcerations noted.  Neck: No adenopathy noted. Cardiovascular: Normal rate and rhythm. S1,S2 noted.  No murmur, rubs or gallops noted.  Pulmonary/Chest: Normal effort and positive vesicular breath sounds. No respiratory distress. No wheezes, rales or ronchi noted.  Neurological: Alert and oriented.     BMET    Component Value Date/Time   NA 138 12/01/2021 1332   K 4.1 12/01/2021 1332   CL 105 12/01/2021 1332   CO2 25 12/01/2021 1332   GLUCOSE 101 12/01/2021 1332   BUN 15 12/01/2021 1332   CREATININE 1.00 (H) 12/01/2021 1332   CALCIUM 10.0 12/01/2021 1332    Lipid Panel     Component Value Date/Time   CHOL 128 12/01/2021 1332   TRIG 115 12/01/2021 1332   HDL 45 (L) 12/01/2021 1332   CHOLHDL 2.8 12/01/2021 1332   VLDL 13.2 10/25/2019 1230   LDLCALC 63 12/01/2021 1332    CBC    Component Value Date/Time   WBC 10.7 12/01/2021 1332   RBC 4.88 12/01/2021 1332   HGB 14.4 12/01/2021 1332   HCT 42.4 12/01/2021 1332   PLT 253 12/01/2021 1332   MCV 86.9 12/01/2021 1332   MCH 29.5 12/01/2021 1332   MCHC 34.0 12/01/2021 1332   RDW 11.9 12/01/2021 1332   LYMPHSABS 1.8 06/02/2016 1157   MONOABS 0.8 06/02/2016 1157   EOSABS 0.1 06/02/2016 1157   BASOSABS 0.0 06/02/2016 1157    Hgb A1C Lab Results  Component Value Date   HGBA1C 5.2 12/01/2021            Assessment & Plan:   Assessment and Plan    Viral Upper Respiratory Infection with Cough Fever, cough with yellow sputum, and sore throat since Sunday. No shortness of breath, nausea, vomiting, or diarrhea. Lungs clear on auscultation. Strep test negative.  Rapid COVID/flu test negative.  Likely viral etiology, possibly exacerbated by post-nasal drip. -Prescribe prednisone to decrease tonsillar swelling and clear up post-nasal  drip. -Recommend over-the-counter antihistamine (Claritin, Allegra, or Zyrtec) once daily. -Prescribe cough tablets, Tessalon 100 mg to be taken three times daily. -If no improvement by Monday, patient to call for prescription of antibiotics.  Allergic Rhinitis Chronic sensation of post-nasal drip and throat discomfort. Likely contributing to current symptoms. -Recommend daily over-the-counter antihistamine (Claritin, Allegra, or Zyrtec).          Schedule an appointment for your annual exam Nicki Reaper, NP

## 2023-02-04 NOTE — Patient Instructions (Signed)

## 2023-02-06 ENCOUNTER — Encounter: Payer: Self-pay | Admitting: Internal Medicine

## 2023-02-07 MED ORDER — AZITHROMYCIN 250 MG PO TABS: ORAL_TABLET | ORAL | 0 refills | Status: DC

## 2023-02-17 ENCOUNTER — Encounter: Payer: Self-pay | Admitting: Internal Medicine

## 2023-02-17 ENCOUNTER — Ambulatory Visit (INDEPENDENT_AMBULATORY_CARE_PROVIDER_SITE_OTHER): Payer: Medicaid Other | Admitting: Internal Medicine

## 2023-02-17 VITALS — BP 118/62 | Ht 59.0 in | Wt 179.0 lb

## 2023-02-17 DIAGNOSIS — E66812 Obesity, class 2: Secondary | ICD-10-CM

## 2023-02-17 DIAGNOSIS — Z23 Encounter for immunization: Secondary | ICD-10-CM

## 2023-02-17 DIAGNOSIS — Z136 Encounter for screening for cardiovascular disorders: Secondary | ICD-10-CM

## 2023-02-17 DIAGNOSIS — Z0001 Encounter for general adult medical examination with abnormal findings: Secondary | ICD-10-CM

## 2023-02-17 DIAGNOSIS — Z6836 Body mass index (BMI) 36.0-36.9, adult: Secondary | ICD-10-CM

## 2023-02-17 DIAGNOSIS — R739 Hyperglycemia, unspecified: Secondary | ICD-10-CM

## 2023-02-17 DIAGNOSIS — E010 Iodine-deficiency related diffuse (endemic) goiter: Secondary | ICD-10-CM

## 2023-02-17 NOTE — Progress Notes (Signed)
Subjective:    Patient ID: Joanna Walsh, female    DOB: [birth year], 27 y.o.   MRN: [id number]  HPI  Patient presents to the clinic today for her annual exam.  Flu: 2023 Tetanus: 06/2020 COVID: X 2 Pap smear: [facility] Dentist:biannually  Diet: She does eat meat. She consumes fruits and veggies. She does eat some fried foods. She drinks mostly water. Exercise: None  Review of Systems     Past Medical History:  Diagnosis Date   Chicken pox    Depression    Pregnancy induced hypertension     Current Outpatient Medications  Medication Sig Dispense Refill   azithromycin (ZITHROMAX) 250 MG tablet Take 2 tabs today, then 1 tab daily x 4 days 6 tablet 0   benzonatate (TESSALON) 100 MG capsule Take 1 capsule (100 mg total) by mouth 3 (three) times daily as needed for cough. 30 capsule 0   fluticasone (FLONASE) 50 MCG/ACT nasal spray Place 2 sprays into both nostrils daily. 16 g 6   NUVARING 0.12-0.015 MG/24HR vaginal ring SMARTSIG:1 Ring Vaginal Once a Month     predniSONE (DELTASONE) 10 MG tablet Take 6 tabs on day 1, 5 tabs on day 2, 4 tabs on day 3, 3 tabs on day 4, 2 tabs on day 5, 1 tab on day 6 21 tablet 0   No current facility-administered medications for this visit.    No Known Allergies  Family History  Problem Relation Age of Onset   Lupus Sister    Prostate cancer Maternal Grandfather    Heart disease Maternal Grandfather     Social History   Socioeconomic History   Marital status: Married    Spouse name: Not on file   Number of children: Not on file   Years of education: Not on file   Highest education level: Not on file  Occupational History   Not on file  Tobacco Use   Smoking status: Never   Smokeless tobacco: Never  Vaping Use   Vaping status: Never Used  Substance and Sexual Activity   Alcohol use: No    Alcohol/week: 0.0 standard drinks of alcohol   Drug use: No   Sexual activity: Never  Other Topics Concern   Not on  file  Social History Narrative   Not on file   Social Determinants of Health   Financial Resource Strain: Not on file  Food Insecurity: Not on file  Transportation Needs: Not on file  Physical Activity: Not on file  Stress: Not on file  Social Connections: Not on file  Intimate Partner Violence: Not on file     Constitutional: Denies fever, malaise, fatigue, headache or abrupt weight changes.  HEENT: Denies eye pain, eye redness, ear pain, ringing in the ears, wax buildup, runny nose, nasal congestion, bloody nose, or sore throat. Respiratory: Denies difficulty breathing, shortness of breath, cough or sputum production.   Cardiovascular: Denies chest pain, chest tightness, palpitations or swelling in the hands or feet.  Gastrointestinal: Denies abdominal pain, bloating, constipation, diarrhea or blood in the stool.  GU: Pt reports irregular periods. Denies urgency, frequency, pain with urination, burning sensation, blood in urine, odor or discharge. Musculoskeletal: Denies decrease in range of motion, difficulty with gait, muscle pain or joint pain and swelling.  Skin: Denies redness, rashes, lesions or ulcercations.  Neurological: Denies dizziness, difficulty with memory, difficulty with speech or problems with balance and coordination.  Psych: Patient has a history of depression.  Denies anxiety,  SI/HI.  No other specific complaints in a complete review of systems (except as listed in HPI above).  Objective:   Physical Exam   BP 118/62   Ht 4\' 11"  (1.499 m)   Wt 179 lb (81.2 kg)   LMP  (LMP Unknown)   BMI 36.15 kg/m   Wt Readings from Last 3 Encounters:  02/04/23 183 lb (83 kg)  12/01/21 168 lb (76.2 kg)  11/27/20 186 lb 3.2 oz (84.5 kg)    General: Appears her stated age, obese, in NAD. Skin: Warm, dry and intact.  HEENT: Head: normal shape and size; Eyes: sclera white, no icterus, conjunctiva pink, PERRLA and EOMs intact;  Neck:  Neck supple, trachea midline. No  masses, lumps or thyromegaly present.  Cardiovascular: Normal rate and rhythm. S1,S2 noted.  No murmur, rubs or gallops noted. No JVD or BLE edema.  Pulmonary/Chest: Normal effort and positive vesicular breath sounds. No respiratory distress. No wheezes, rales or ronchi noted.  Abdomen: Soft and nontender. Normal bowel sounds.  Musculoskeletal: Strength 5/5 BUE/BLE. No difficulty with gait.  Neurological: Alert and oriented. Cranial nerves II-XII grossly intact. Coordination normal.  Psychiatric: Mood and affect normal. Behavior is normal. Judgment and thought content normal.    BMET    Component Value Date/Time   NA 138 12/01/2021 1332   K 4.1 12/01/2021 1332   CL 105 12/01/2021 1332   CO2 25 12/01/2021 1332   GLUCOSE 101 12/01/2021 1332   BUN 15 12/01/2021 1332   CREATININE 1.00 (H) 12/01/2021 1332   CALCIUM 10.0 12/01/2021 1332    Lipid Panel     Component Value Date/Time   CHOL 128 12/01/2021 1332   TRIG 115 12/01/2021 1332   HDL 45 (L) 12/01/2021 1332   CHOLHDL 2.8 12/01/2021 1332   VLDL 13.2 10/25/2019 1230   LDLCALC 63 12/01/2021 1332    CBC    Component Value Date/Time   WBC 10.7 12/01/2021 1332   RBC 4.88 12/01/2021 1332   HGB 14.4 12/01/2021 1332   HCT 42.4 12/01/2021 1332   PLT 253 12/01/2021 1332   MCV 86.9 12/01/2021 1332   MCH 29.5 12/01/2021 1332   MCHC 34.0 12/01/2021 1332   RDW 11.9 12/01/2021 1332   LYMPHSABS 1.8 06/02/2016 1157   MONOABS 0.8 06/02/2016 1157   EOSABS 0.1 06/02/2016 1157   BASOSABS 0.0 06/02/2016 1157    Hgb A1C Lab Results  Component Value Date   HGBA1C 5.2 12/01/2021           Assessment & Plan:   Preventative health maintenance:  Flu shot today Tetanus UTD Encouraged her to get her COVID booster Pap smear UTD, will request records Encouraged her to consume a balanced diet and exercise regimen Advised her to see an eye doctor and dentist annually We will check CBC, c-Met, TSH, lipid, A1c today  RTC in 1  year, sooner if needed Nicki Reaper, NP

## 2023-02-17 NOTE — Assessment & Plan Note (Signed)
Encouraged diet and exercise for weight loss ?

## 2023-02-17 NOTE — Patient Instructions (Signed)

## 2023-02-18 ENCOUNTER — Encounter: Payer: Self-pay | Admitting: Internal Medicine

## 2023-02-18 LAB — CBC
HCT: 43.1 % (ref 35.0–45.0)
Hemoglobin: 14.2 g/dL (ref 11.7–15.5)
MCH: 29.1 pg (ref 27.0–33.0)
MCHC: 32.9 g/dL (ref 32.0–36.0)
MCV: 88.3 fL (ref 80.0–100.0)
MPV: 11.4 fL (ref 7.5–12.5)
Platelets: 257 10*3/uL (ref 140–400)
RBC: 4.88 10*6/uL (ref 3.80–5.10)
RDW: 11.7 % (ref 11.0–15.0)
WBC: 7.7 10*3/uL (ref 3.8–10.8)

## 2023-02-18 LAB — LIPID PANEL
Cholesterol: 166 mg/dL (ref <200)
HDL: 45 mg/dL — ABNORMAL LOW (ref >=50)
LDL Cholesterol (Calc): 97 mg/dL
Non-HDL Cholesterol (Calc): 121 mg/dL (ref <130)
Total CHOL/HDL Ratio: 3.7 (calc) (ref <5.0)
Triglycerides: 142 mg/dL (ref <150)

## 2023-02-18 LAB — COMPLETE METABOLIC PANEL WITH GFR
AG Ratio: 1.8 (calc) (ref 1.0–2.5)
ALT: 23 U/L (ref 6–29)
AST: 14 U/L (ref 10–30)
Albumin: 4.4 g/dL (ref 3.6–5.1)
Alkaline phosphatase (APISO): 56 U/L (ref 31–125)
BUN: 14 mg/dL (ref 7–25)
CO2: 26 mmol/L (ref 20–32)
Calcium: 9.9 mg/dL (ref 8.6–10.2)
Chloride: 105 mmol/L (ref 98–110)
Creat: 0.73 mg/dL (ref 0.50–0.96)
Globulin: 2.5 g/dL (ref 1.9–3.7)
Glucose, Bld: 110 mg/dL (ref 65–139)
Potassium: 4.1 mmol/L (ref 3.5–5.3)
Sodium: 140 mmol/L (ref 135–146)
Total Bilirubin: 0.8 mg/dL (ref 0.2–1.2)
Total Protein: 6.9 g/dL (ref 6.1–8.1)
eGFR: 117 mL/min/{1.73_m2} (ref >=60)

## 2023-02-18 LAB — TSH: TSH: 1.08 m[IU]/L

## 2023-02-18 LAB — HEMOGLOBIN A1C
Hgb A1c MFr Bld: 5.7 %{Hb} — ABNORMAL HIGH (ref <5.7)
Mean Plasma Glucose: 117 mg/dL
eAG (mmol/L): 6.5 mmol/L

## 2023-07-26 IMAGING — DX DG KNEE COMPLETE 4+V*R*
4 series · 4 of 4 positions shown · non-contrast
Comparison: None.

CLINICAL DATA: Acute right knee pain.  Pain for 5 days.

EXAM:
RIGHT KNEE - COMPLETE 4+ VIEW

[knee ap]
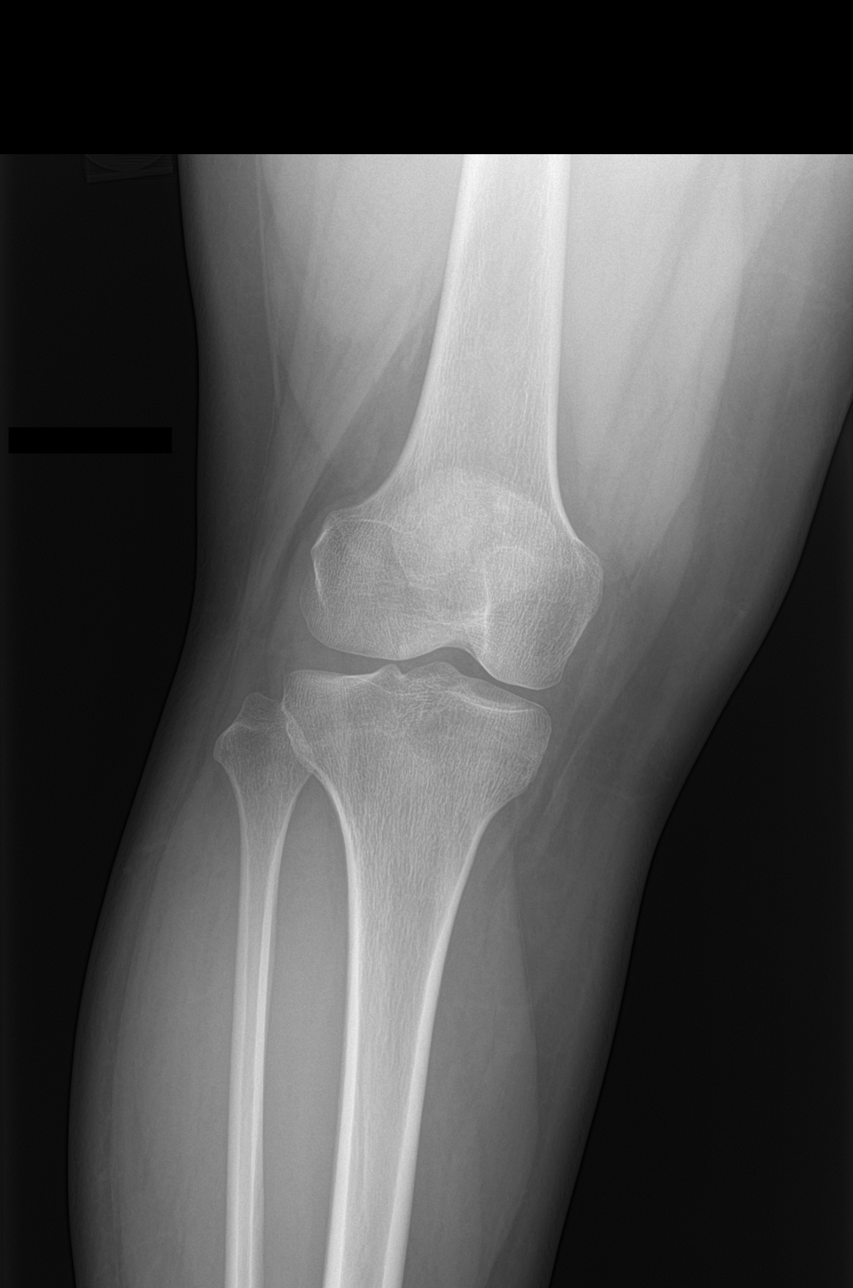

[knee tunnel]
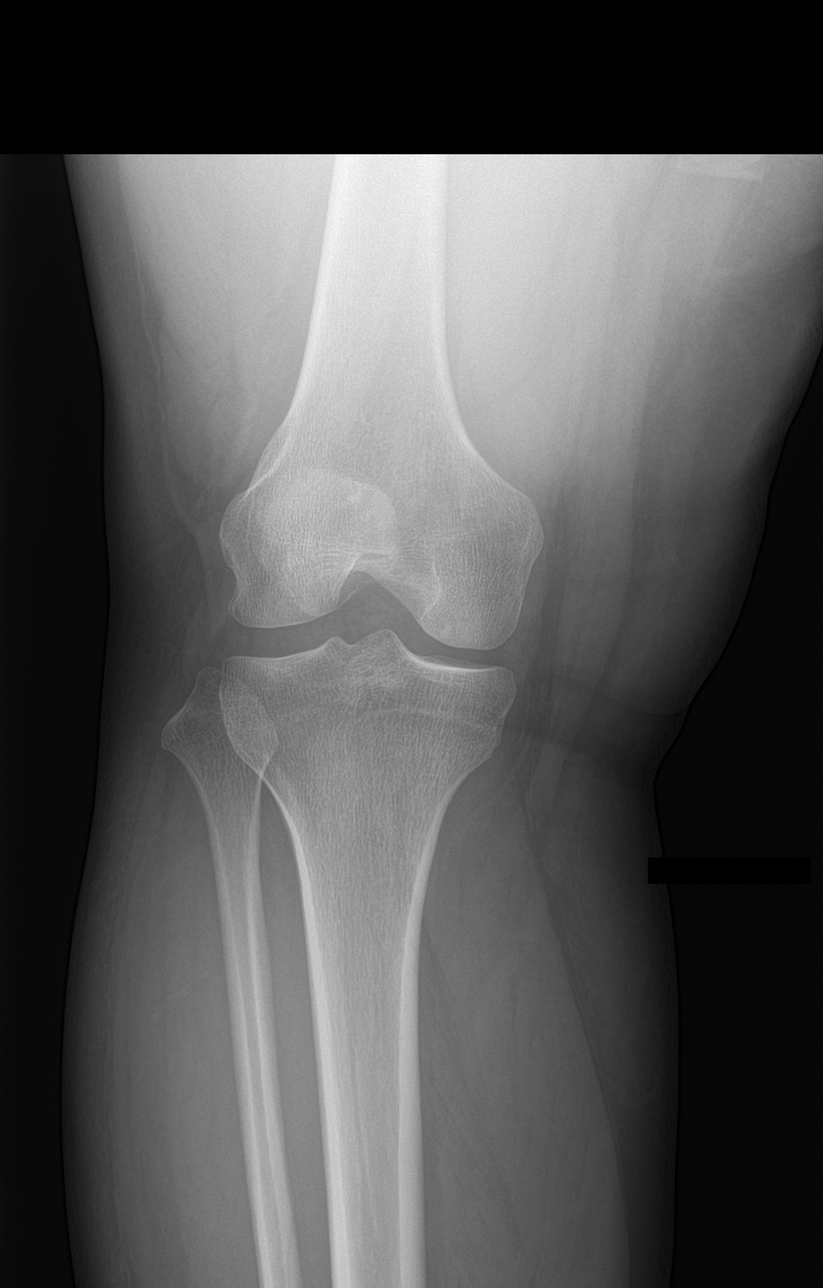

[knee lat]
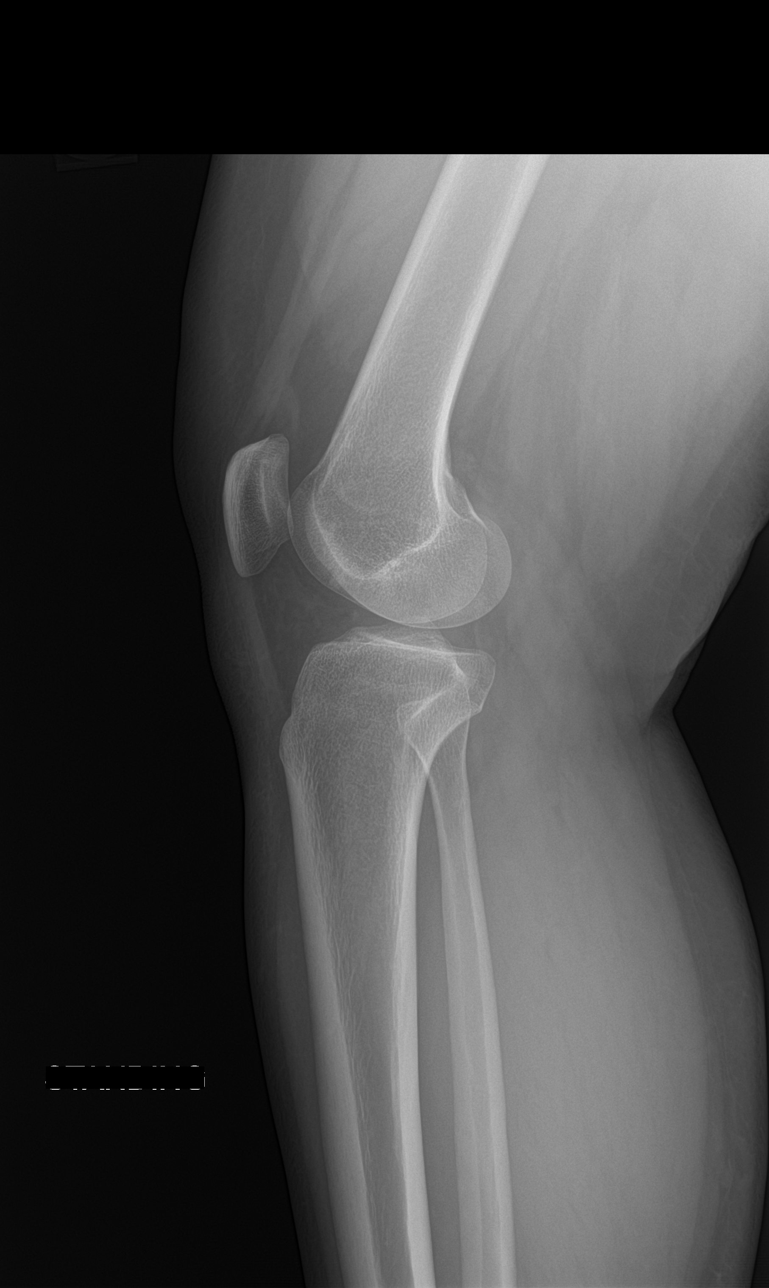

[patella skyline]
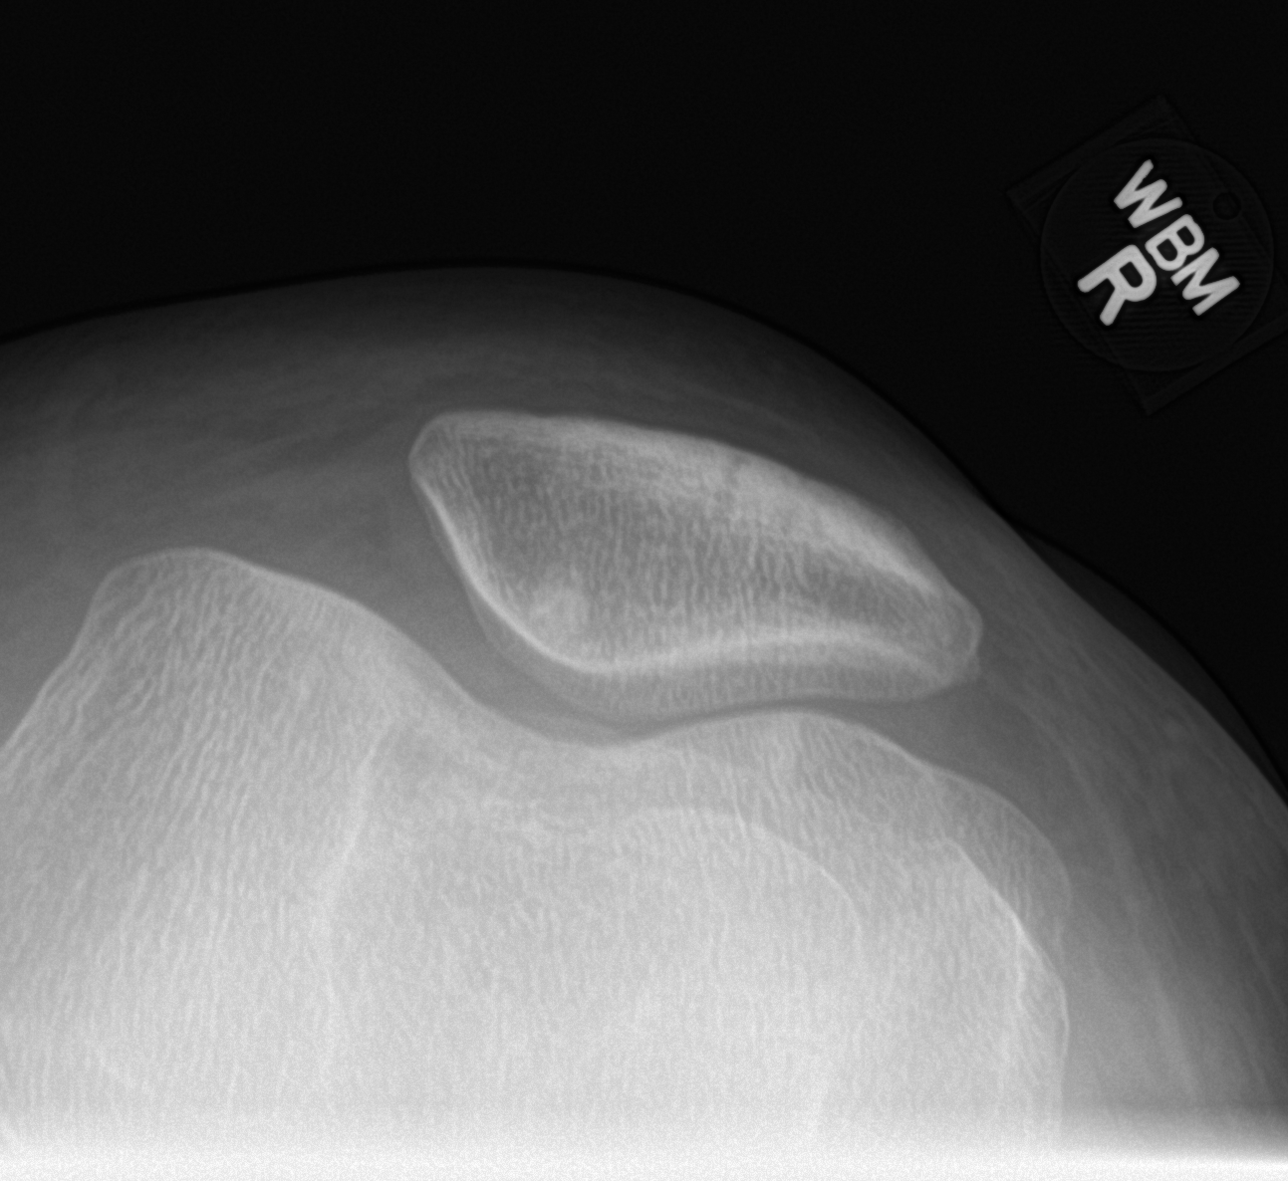

[4 of 4 positions shown; findings below may reference images not displayed]

FINDINGS: Imaging obtained standing. Normal joint spaces and alignment.
Patella normally situated in the trochlear groove. No fracture. No
erosion, periosteal reaction, or focal bone abnormality. Trace joint
effusion
IMPRESSION: Trace joint effusion. Otherwise unremarkable radiographs of the
right knee.

## 2023-12-06 DIAGNOSIS — Z419 Encounter for procedure for purposes other than remedying health state, unspecified: Secondary | ICD-10-CM | POA: Diagnosis not present
